# Patient Record
Sex: Female | Born: 1985 | Race: Black or African American | Hispanic: No | Marital: Single
Health system: Southern US, Community
[De-identification: ages and names within clinical notes are randomized; demographics above are authoritative.]

## PROBLEM LIST (undated history)

## (undated) DIAGNOSIS — J302 Other seasonal allergic rhinitis: Secondary | ICD-10-CM

## (undated) DIAGNOSIS — L309 Dermatitis, unspecified: Secondary | ICD-10-CM

## (undated) DIAGNOSIS — F419 Anxiety disorder, unspecified: Secondary | ICD-10-CM

## (undated) DIAGNOSIS — O139 Gestational [pregnancy-induced] hypertension without significant proteinuria, unspecified trimester: Secondary | ICD-10-CM

## (undated) DIAGNOSIS — T7840XA Allergy, unspecified, initial encounter: Secondary | ICD-10-CM

## (undated) DIAGNOSIS — J069 Acute upper respiratory infection, unspecified: Secondary | ICD-10-CM

## (undated) DIAGNOSIS — K219 Gastro-esophageal reflux disease without esophagitis: Secondary | ICD-10-CM

## (undated) DIAGNOSIS — J45909 Unspecified asthma, uncomplicated: Secondary | ICD-10-CM

## (undated) DIAGNOSIS — T783XXA Angioneurotic edema, initial encounter: Secondary | ICD-10-CM

## (undated) DIAGNOSIS — D649 Anemia, unspecified: Secondary | ICD-10-CM

## (undated) HISTORY — PX: TYMPANOSTOMY TUBE PLACEMENT: SHX32

## (undated) HISTORY — DX: Angioneurotic edema, initial encounter: T78.3XXA

## (undated) HISTORY — PX: NO PAST SURGERIES: SHX2092

## (undated) HISTORY — DX: Allergy, unspecified, initial encounter: T78.40XA

## (undated) HISTORY — DX: Anemia, unspecified: D64.9

## (undated) HISTORY — DX: Unspecified asthma, uncomplicated: J45.909

## (undated) HISTORY — DX: Acute upper respiratory infection, unspecified: J06.9

---
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
## 2008-02-03 ENCOUNTER — Emergency Department (HOSPITAL_COMMUNITY): Admission: EM | Admit: 2008-02-03 | Discharge: 2008-02-03 | Payer: Self-pay | Admitting: Family Medicine

## 2009-04-10 ENCOUNTER — Emergency Department (HOSPITAL_COMMUNITY): Admission: EM | Admit: 2009-04-10 | Discharge: 2009-04-10 | Payer: Self-pay | Admitting: Emergency Medicine

## 2012-01-12 ENCOUNTER — Encounter (HOSPITAL_COMMUNITY): Payer: Self-pay | Admitting: *Deleted

## 2012-01-12 ENCOUNTER — Emergency Department (HOSPITAL_COMMUNITY)
Admission: EM | Admit: 2012-01-12 | Discharge: 2012-01-12 | Disposition: A | Discharge status: Home / Self Care | Payer: BC Managed Care – PPO | Source: Home / Self Care | Attending: Emergency Medicine | Admitting: Emergency Medicine

## 2012-01-12 DIAGNOSIS — R05 Cough: Secondary | ICD-10-CM

## 2012-01-12 HISTORY — DX: Other seasonal allergic rhinitis: J30.2

## 2012-01-12 MED ORDER — AZITHROMYCIN 250 MG PO TABS: 250.0000 mg | ORAL_TABLET | Freq: Every day | ORAL | Status: DC

## 2012-01-12 MED ORDER — GUAIFENESIN-CODEINE 100-10 MG/5ML PO SYRP: 5.0000 mL | ORAL_SOLUTION | Freq: Three times a day (TID) | ORAL | Status: DC | PRN

## 2012-01-12 MED ORDER — CETIRIZINE-PSEUDOEPHEDRINE ER 5-120 MG PO TB12: 1.0000 | ORAL_TABLET | Freq: Every day | ORAL | Status: DC

## 2012-01-12 NOTE — ED Notes (Signed)
Pt  Has  Symptoms  Of  Cough    /  Congested   Started  Off  With a  sorethroat        symptooms x  3  Days  -    Pt      Reports  Has  Seasonal  allergys             And  Has  Been taking  Robitussin     otc  For the  Symptoms  Pt  Reports  Pain in  Her  Sides  When  She  Coughs

## 2012-01-12 NOTE — ED Provider Notes (Addendum)
History     CSN: [id number]  Arrival date & time 01/12/12  1533   First MD Initiated Contact with Patient 01/12/12 1641      Chief Complaint  Patient presents with  . Cough    (Consider location/radiation/quality/duration/timing/severity/associated sxs/prior treatment) HPI Comments: Patient presents urgent care complaining of cough and congestion that started with a sore throat. In some sinus pressure and congestion this being the third day that she started developing the symptoms and has been taking over-the-counter Robitussin. With no significant improvement. Patient denies any shortness of breath fevers or body aches. Have had some non-intermittent abdominal pains that come and go but denies any nausea vomiting or diarrhea as.  Patient is a 26 y.o. female presenting with cough. The history is provided by the patient.  Cough This is a new problem. The problem occurs constantly. The problem has been gradually worsening. The cough is productive of sputum. There has been no fever. Associated symptoms include ear congestion, rhinorrhea and sore throat. Pertinent negatives include no chest pain, no chills, no sweats, no myalgias, no shortness of breath, no wheezing and no eye redness. She has tried decongestants for the symptoms. The treatment provided no relief. She is not a smoker. Her past medical history does not include pneumonia, COPD or asthma.    Past Medical History  Diagnosis Date  . Seasonal allergies     History reviewed. No pertinent past surgical history.  Family History  Problem Relation Age of Onset  . Hypertension Mother     History  Substance Use Topics  . Smoking status: Never Smoker   . Smokeless tobacco: Not on file  . Alcohol Use: No    OB History    Grav Para Term Preterm Abortions TAB SAB Ect Mult Living                  Review of Systems  Constitutional: Positive for activity change. Negative for fever and chills.  HENT: Positive for congestion,  sore throat and rhinorrhea. Negative for facial swelling, neck pain and neck stiffness.   Eyes: Negative for redness.  Respiratory: Positive for cough. Negative for apnea, chest tightness, shortness of breath and wheezing.   Cardiovascular: Negative for chest pain.  Gastrointestinal: Negative for abdominal pain and diarrhea.  Musculoskeletal: Negative for myalgias and back pain.  Skin: Negative for rash.  Hematological: Negative for adenopathy.    Allergies  Review of patient's allergies indicates no known allergies.  Home Medications   Current Outpatient Rx  Name  Route  Sig  Dispense  Refill  . ROBITUSSIN DM PO   Oral   Take by mouth.         . AZITHROMYCIN 250 MG PO TABS   Oral   Take 1 tablet (250 mg total) by mouth daily. Take first 2 tablets together, then 1 every day until finished.   6 tablet   0   . CETIRIZINE-PSEUDOEPHEDRINE ER 5-120 MG PO TB12   Oral   Take 1 tablet by mouth daily.   15 tablet   0   . GUAIFENESIN-CODEINE 100-10 MG/5ML PO SYRP   Oral   Take 5 mLs by mouth 3 (three) times daily as needed for cough.   120 mL   0     BP 130/73  Pulse 86  Temp 99.4 F (37.4 C) (Oral)  Resp 17  SpO2 100%  LMP 01/12/2012  Physical Exam  Nursing note and vitals reviewed. Constitutional: Vital signs are normal. She appears  well-developed and well-nourished.  Non-toxic appearance. She does not have a sickly appearance. She does not appear ill. No distress.  HENT:  Head: Normocephalic.  Right Ear: Tympanic membrane normal.  Left Ear: Tympanic membrane normal.  Nose: Nose normal.  Mouth/Throat: Uvula is midline and mucous membranes are normal. Posterior oropharyngeal erythema present.  Eyes: Conjunctivae normal and EOM are normal. Pupils are equal, round, and reactive to light. Right eye exhibits no discharge. Left eye exhibits no discharge.  Neck: Neck supple. No JVD present.  Cardiovascular: Normal rate.  Exam reveals no gallop and no friction rub.   No  murmur heard. Pulmonary/Chest: Effort normal and breath sounds normal.  Abdominal: She exhibits no mass. There is no tenderness. There is no rebound and no guarding.  Musculoskeletal: Normal range of motion.  Lymphadenopathy:    She has no cervical adenopathy.  Neurological: She is alert.  Skin: No rash noted. No erythema.    ED Course  Procedures (including critical care time)  Labs Reviewed - No data to display No results found.   1. Cough       MDM  Cough and pharyngitis most likely viral. Patient been symptomatic for 5 days. Had an unremarkable exam. Patient argumentative that she needs an antibiotic. Have recommended symptomatic management for the next 2-3 days and if worsening pain to start with provided macrolide antibiotics.        Jimmie Molly, MD 01/12/12 1710  Jimmie Molly, MD 01/12/12 [phone]

## 2012-11-11 ENCOUNTER — Emergency Department (HOSPITAL_COMMUNITY): Payer: BC Managed Care – PPO

## 2012-11-11 ENCOUNTER — Encounter (HOSPITAL_COMMUNITY): Payer: Self-pay | Admitting: Emergency Medicine

## 2012-11-11 ENCOUNTER — Emergency Department (INDEPENDENT_AMBULATORY_CARE_PROVIDER_SITE_OTHER): Payer: BC Managed Care – PPO

## 2012-11-11 ENCOUNTER — Emergency Department (HOSPITAL_COMMUNITY)
Admission: EM | Admit: 2012-11-11 | Discharge: 2012-11-11 | Disposition: A | Discharge status: Home / Self Care | Payer: BC Managed Care – PPO | Source: Home / Self Care | Attending: Family Medicine | Admitting: Family Medicine

## 2012-11-11 DIAGNOSIS — R05 Cough: Secondary | ICD-10-CM

## 2012-11-11 DIAGNOSIS — J45909 Unspecified asthma, uncomplicated: Secondary | ICD-10-CM

## 2012-11-11 LAB — POCT PREGNANCY, URINE: Preg Test, Ur: NEGATIVE

## 2012-11-11 MED ORDER — HYDROCOD POLST-CHLORPHEN POLST 10-8 MG/5ML PO LQCR: 5.0000 mL | Freq: Two times a day (BID) | ORAL | Status: DC | PRN

## 2012-11-11 MED ORDER — AZITHROMYCIN 250 MG PO TABS: ORAL_TABLET | ORAL | Status: DC

## 2012-11-11 MED ORDER — METHYLPREDNISOLONE SODIUM SUCC 125 MG IJ SOLR
125.0000 mg | Freq: Once | INTRAMUSCULAR | Status: AC
  Administered 2012-11-11: 125 mg via INTRAMUSCULAR

## 2012-11-11 MED ORDER — METHYLPREDNISOLONE SODIUM SUCC 125 MG IJ SOLR
INTRAMUSCULAR | Status: AC
  Filled 2012-11-11: qty 2

## 2012-11-11 NOTE — ED Provider Notes (Signed)
CSN: [id number]     Arrival date & time 11/11/12  1833 History   None    Chief Complaint  Patient presents with  . URI   (Consider location/radiation/quality/duration/timing/severity/associated sxs/prior Treatment) Patient is a 27 y.o. female presenting with URI. The history is provided by the patient.  URI Presenting symptoms: congestion, cough, fever and rhinorrhea   Severity:  Moderate Onset quality:  Gradual Duration:  5 days Progression:  Worsening Chronicity:  New Ineffective treatments:  OTC medications Associated symptoms: wheezing     Past Medical History  Diagnosis Date  . Seasonal allergies    History reviewed. No pertinent past surgical history. Family History  Problem Relation Age of Onset  . Hypertension Mother    History  Substance Use Topics  . Smoking status: Never Smoker   . Smokeless tobacco: Not on file  . Alcohol Use: Yes   OB History   Grav Para Term Preterm Abortions TAB SAB Ect Mult Living                 Review of Systems  Constitutional: Positive for fever.  HENT: Positive for congestion and rhinorrhea.   Respiratory: Positive for cough and wheezing.   Cardiovascular: Negative.   Gastrointestinal: Negative.   Genitourinary: Negative.     Allergies  Review of patient's allergies indicates no known allergies.  Home Medications   Current Outpatient Rx  Name  Route  Sig  Dispense  Refill  . azithromycin (ZITHROMAX Z-PAK) 250 MG tablet      Take as directed on pack   6 each   0   . azithromycin (ZITHROMAX) 250 MG tablet   Oral   Take 1 tablet (250 mg total) by mouth daily. Take first 2 tablets together, then 1 every day until finished.   6 tablet   0   . cetirizine-pseudoephedrine (ZYRTEC-D) 5-120 MG per tablet   Oral   Take 1 tablet by mouth daily.   15 tablet   0   . chlorpheniramine-HYDROcodone (TUSSIONEX PENNKINETIC ER) 10-8 MG/5ML LQCR   Oral   Take 5 mLs by mouth every 12 (twelve) hours as needed.   115 mL   0    . Dextromethorphan-Guaifenesin (ROBITUSSIN DM PO)   Oral   Take by mouth.         Marland Kitchen guaiFENesin-codeine (ROBITUSSIN AC) 100-10 MG/5ML syrup   Oral   Take 5 mLs by mouth 3 (three) times daily as needed for cough.   120 mL   0    BP 128/85  Pulse 108  Temp(Src) 100 F (37.8 C) (Oral)  Resp 18  SpO2 98%  LMP 10/17/2012 Physical Exam  Nursing note and vitals reviewed. Constitutional: She is oriented to person, place, and time. She appears well-developed and well-nourished.  HENT:  Head: Normocephalic.  Right Ear: External ear normal.  Left Ear: External ear normal.  Mouth/Throat: Oropharynx is clear and moist.  Eyes: Conjunctivae are normal. Pupils are equal, round, and reactive to light.  Neck: Normal range of motion. Neck supple.  Cardiovascular: Normal rate, regular rhythm, normal heart sounds and intact distal pulses.   Pulmonary/Chest: No respiratory distress. She has wheezes in the right upper field, the right middle field, the right lower field, the left upper field, the left middle field and the left lower field.  Abdominal: Soft. Bowel sounds are normal.  Lymphadenopathy:    She has no cervical adenopathy.  Neurological: She is alert and oriented to person, place, and time.  Skin:  Skin is warm and dry.    ED Course  Procedures (including critical care time) Labs Review Labs Reviewed  POCT PREGNANCY, URINE   Imaging Review Dg Chest 2 View  11/11/2012   CLINICAL DATA:  Pain and shortness of breath  EXAM: CHEST  2 VIEW  COMPARISON:  Study obtained earlier in the day  FINDINGS: Lungs are clear. Heart size and pulmonary vascularity are normal. No adenopathy. No bone lesions. No pneumothorax.  IMPRESSION: No edema or consolidation.   Electronically Signed   By: Bretta Bang   On: 11/11/2012 21:28   Dg Chest 2 View  11/11/2012   **ADDENDUM** CREATED: 11/11/2012 20:55:18  Note, this radiograph is not for patient Maegen, Wigle, as was initially submitted.   **END ADDENDUM** SIGNED BY: Dyanne Carrel, MD  11/11/2012   *RADIOLOGY REPORT*  Clinical Data: Cough, shortness of breath, sneezing, initial encounter.  CHEST - 2 VIEW  Comparison: None.  Findings:  Normal cardiac silhouette and mediastinal contours.  No focal parenchymal opacities.  No pleural effusion or pneumothorax.  No evidence of edema.  No acute osseous abnormalities.  IMPRESSION: No acute cardiopulmonary disease.  Specifically, no evidence of pneumonia.   Original Report Authenticated By: Tacey Ruiz, MD    MDM  X-rays reviewed and report per radiologist.     Linna Hoff, MD 11/11/12 2141

## 2012-11-11 NOTE — ED Notes (Signed)
Pt c/o cold sxs onset Saturday Sxs include: fever, productive cough, nasal congestion, SOB, wheezing and back pain Denies: v/n/d Taking OTC cold meds w/temp relief.  She is alert w/no signs of acute distress.

## 2012-11-23 ENCOUNTER — Other Ambulatory Visit (HOSPITAL_COMMUNITY): Payer: Self-pay | Admitting: Family Medicine

## 2012-11-23 DIAGNOSIS — R05 Cough: Secondary | ICD-10-CM

## 2013-06-02 ENCOUNTER — Encounter (HOSPITAL_COMMUNITY): Payer: Self-pay | Admitting: Emergency Medicine

## 2013-06-02 ENCOUNTER — Emergency Department (HOSPITAL_COMMUNITY)
Admission: EM | Admit: 2013-06-02 | Discharge: 2013-06-02 | Disposition: A | Discharge status: Home / Self Care | Payer: BC Managed Care – PPO | Source: Home / Self Care | Attending: Family Medicine | Admitting: Family Medicine

## 2013-06-02 DIAGNOSIS — J309 Allergic rhinitis, unspecified: Secondary | ICD-10-CM

## 2013-06-02 DIAGNOSIS — J45901 Unspecified asthma with (acute) exacerbation: Secondary | ICD-10-CM

## 2013-06-02 MED ORDER — ALBUTEROL SULFATE HFA 108 (90 BASE) MCG/ACT IN AERS: 2.0000 | INHALATION_SPRAY | Freq: Four times a day (QID) | RESPIRATORY_TRACT | Status: DC | PRN

## 2013-06-02 MED ORDER — GUAIFENESIN-CODEINE 100-10 MG/5ML PO SOLN: 5.0000 mL | Freq: Every evening | ORAL | Status: DC | PRN

## 2013-06-02 MED ORDER — IPRATROPIUM-ALBUTEROL 0.5-2.5 (3) MG/3ML IN SOLN
3.0000 mL | Freq: Once | RESPIRATORY_TRACT | Status: AC
  Administered 2013-06-02: 3 mL via RESPIRATORY_TRACT

## 2013-06-02 MED ORDER — IPRATROPIUM-ALBUTEROL 0.5-2.5 (3) MG/3ML IN SOLN
RESPIRATORY_TRACT | Status: AC
  Filled 2013-06-02: qty 3

## 2013-06-02 MED ORDER — PREDNISONE 50 MG PO TABS: 50.0000 mg | ORAL_TABLET | Freq: Every day | ORAL | Status: DC

## 2013-06-02 NOTE — ED Notes (Signed)
Pt c/o cold sx onset 2 weeks Sx include: SOB, wheezing, productive cough, nauseas, congestion Denies f/v/d Taking OTC cold/allergy meds w/no relief Alert; wheezing through out; no signs of acute distress.

## 2013-06-02 NOTE — ED Provider Notes (Signed)
Tammy Butler is a 28 y.o. female who presents to Urgent Care today for chest congestion cough chills and wheezing. Symptoms are associated with runny nose present for about 2 weeks. No fevers chills nausea vomiting or diarrhea. Patient has tried NyQuil Zyrtec and Nasacort spray which has helped a little. She has a history of asthma. She does not have albuterol.   Past Medical History  Diagnosis Date  . Seasonal allergies    History  Substance Use Topics  . Smoking status: Never Smoker   . Smokeless tobacco: Not on file  . Alcohol Use: Yes   ROS as above Medications: No current facility-administered medications for this encounter.   Current Outpatient Prescriptions  Medication Sig Dispense Refill  . albuterol (PROVENTIL HFA;VENTOLIN HFA) 108 (90 BASE) MCG/ACT inhaler Inhale 2 puffs into the lungs every 6 (six) hours as needed for wheezing or shortness of breath.  1 Inhaler  2  . guaiFENesin-codeine 100-10 MG/5ML syrup Take 5 mLs by mouth at bedtime as needed for cough.  120 mL  0  . predniSONE (DELTASONE) 50 MG tablet Take 1 tablet (50 mg total) by mouth daily.  5 tablet  0    Exam:  BP 148/96  Pulse 79  Temp(Src) 98.8 F (37.1 C) (Oral)  Resp 23  SpO2 100%  LMP 04/15/2013 Gen: Well NAD HEENT: EOMI,  MMM Lungs: Normal work of breathing. Expiratory wheezing present bilaterally. Heart: RRR no MRG Abd: NABS, Soft. NT, ND Exts: Brisk capillary refill, warm and well perfused.   Pt was given a duoneb treatment and felt better with improvement with pulmonary exam.   No results found for this or any previous visit (from the past 24 hour(s)). No results found.  Assessment and Plan: 28 y.o. female with asthma exacerbation. Plan to treat with prednisone and albuterol as well as codeine cough medicine.    Discussed warning signs or symptoms. Please see discharge instructions. Patient expresses understanding.    Rodolph BongEvan S Corey, MD 06/02/13 [phone]

## 2013-06-02 NOTE — Discharge Instructions (Signed)
Thank you for coming in today. Call or go to the emergency room if you get worse, have trouble breathing, have chest pains, or palpitations.   Asthma, Acute Bronchospasm Acute bronchospasm caused by asthma is also referred to as an asthma attack. Bronchospasm means your air passages become narrowed. The narrowing is caused by inflammation and tightening of the muscles in the air tubes (bronchi) in your lungs. This can make it hard to breath or cause you to wheeze and cough. CAUSES Possible triggers are:  Animal dander from the skin, hair, or feathers of animals.  Dust mites contained in house dust.  Cockroaches.  Pollen from trees or grass.  Mold.  Cigarette or tobacco smoke.  Air pollutants such as dust, household cleaners, hair sprays, aerosol sprays, paint fumes, strong chemicals, or strong odors.  Cold air or weather changes. Cold air may trigger inflammation. Winds increase molds and pollens in the air.  Strong emotions such as crying or laughing hard.  Stress.  Certain medicines such as aspirin or beta-blockers.  Sulfites in foods and drinks, such as dried fruits and wine.  Infections or inflammatory conditions, such as a flu, cold, or inflammation of the nasal membranes (rhinitis).  Gastroesophageal reflux disease (GERD). GERD is a condition where stomach acid backs up into your throat (esophagus).  Exercise or strenuous activity. SIGNS AND SYMPTOMS   Wheezing.  Excessive coughing, particularly at night.  Chest tightness.  Shortness of breath. DIAGNOSIS  Your health care provider will ask you about your medical history and perform a physical exam. A chest X-ray or blood testing may be performed to look for other causes of your symptoms or other conditions that may have triggered your asthma attack. TREATMENT  Treatment is aimed at reducing inflammation and opening up the airways in your lungs. Most asthma attacks are treated with inhaled medicines. These  include quick relief or rescue medicines (such as bronchodilators) and controller medicines (such as inhaled corticosteroids). These medicines are sometimes given through an inhaler or a nebulizer. Systemic steroid medicine taken by mouth or given through an IV tube also can be used to reduce the inflammation when an attack is moderate or severe. Antibiotic medicines are only used if a bacterial infection is present.  HOME CARE INSTRUCTIONS   Rest.  Drink plenty of liquids. This helps the mucus to remain thin and be easily coughed up. Only use caffeine in moderation and do not use alcohol until you have recovered from your illness.  Do not smoke. Avoid being exposed to secondhand smoke.  You play a critical role in keeping yourself in good health. Avoid exposure to things that cause you to wheeze or to have breathing problems.  Keep your medicines up to date and available. Carefully follow your health care provider's treatment plan.  Take your medicine exactly as prescribed.  When pollen or pollution is bad, keep windows closed and use an air conditioner or go to places with air conditioning.  Asthma requires careful medical care. See your health care provider for a follow-up as advised. If you are more than [redacted] weeks pregnant and you were prescribed any new medicines, let your obstetrician know about the visit and how you are doing. Follow-up with your health care provider as directed.  After you have recovered from your asthma attack, make an appointment with your outpatient doctor to talk about ways to reduce the likelihood of future attacks. If you do not have a doctor who manages your asthma, make an appointment with  a primary care doctor to discuss your asthma. SEEK IMMEDIATE MEDICAL CARE IF:   You are getting worse.  You have trouble breathing. If severe, call your local emergency services (911 in the [place]  You develop chest pain or discomfort.  You are vomiting.  You are not  able to keep fluids down.  You are coughing up yellow, green, brown, or bloody sputum.  You have a fever and your symptoms suddenly get worse.  You have trouble swallowing. MAKE SURE YOU:   Understand these instructions.  Will watch your condition.  Will get help right away if you are not doing well or get worse. Document Released: 05/15/2006 Document Revised: 09/30/2012 Document Reviewed: 08/05/2012 [facility] Patient Information 2014 [place], [place].  Allergies Allergies may happen from anything your body is sensitive to. This may be food, medicines, pollens, chemicals, and nearly anything around you in everyday life that produces allergens. An allergen is anything that causes an allergy producing substance. Heredity is often a factor in causing these problems. This means you may have some of the same allergies as your parents. Food allergies happen in all age groups. Food allergies are some of the most severe and life threatening. Some common food allergies are cow's milk, seafood, eggs, nuts, wheat, and soybeans. SYMPTOMS   Swelling around the mouth.  An itchy red rash or hives.  Vomiting or diarrhea.  Difficulty breathing. SEVERE ALLERGIC REACTIONS ARE LIFE-THREATENING. This reaction is called anaphylaxis. It can cause the mouth and throat to swell and cause difficulty with breathing and swallowing. In severe reactions only a trace amount of food (for example, peanut oil in a salad) may cause death within seconds. Seasonal allergies occur in all age groups. These are seasonal because they usually occur during the same season every year. They may be a reaction to molds, grass pollens, or tree pollens. Other causes of problems are house dust mite allergens, pet dander, and mold spores. The symptoms often consist of nasal congestion, a runny itchy nose associated with sneezing, and tearing itchy eyes. There is often an associated itching of the mouth and ears. The problems happen when  you come in contact with pollens and other allergens. Allergens are the particles in the air that the body reacts to with an allergic reaction. This causes you to release allergic antibodies. Through a chain of events, these eventually cause you to release histamine into the blood stream. Although it is meant to be protective to the body, it is this release that causes your discomfort. This is why you were given anti-histamines to feel better. If you are unable to pinpoint the offending allergen, it may be determined by skin or blood testing. Allergies cannot be cured but can be controlled with medicine. Hay fever is a collection of all or some of the seasonal allergy problems. It may often be treated with simple over-the-counter medicine such as diphenhydramine. Take medicine as directed. Do not drink alcohol or drive while taking this medicine. Check with your caregiver or package insert for child dosages. If these medicines are not effective, there are many new medicines your caregiver can prescribe. Stronger medicine such as nasal spray, eye drops, and corticosteroids may be used if the first things you try do not work well. Other treatments such as immunotherapy or desensitizing injections can be used if all else fails. Follow up with your caregiver if problems continue. These seasonal allergies are usually not life threatening. They are generally more of a nuisance that can often  be handled using medicine. HOME CARE INSTRUCTIONS   If unsure what causes a reaction, keep a diary of foods eaten and symptoms that follow. Avoid foods that cause reactions.  If hives or rash are present:  Take medicine as directed.  You may use an over-the-counter antihistamine (diphenhydramine) for hives and itching as needed.  Apply cold compresses (cloths) to the skin or take baths in cool water. Avoid hot baths or showers. Heat will make a rash and itching worse.  If you are severely allergic:  Following a  treatment for a severe reaction, hospitalization is often required for closer follow-up.  Wear a medic-alert bracelet or necklace stating the allergy.  You and your family must learn how to give adrenaline or use an anaphylaxis kit.  If you have had a severe reaction, always carry your anaphylaxis kit or EpiPen with you. Use this medicine as directed by your caregiver if a severe reaction is occurring. Failure to do so could have a fatal outcome. SEEK MEDICAL CARE IF:  You suspect a food allergy. Symptoms generally happen within 30 minutes of eating a food.  Your symptoms have not gone away within 2 days or are getting worse.  You develop new symptoms.  You want to retest yourself or your child with a food or drink you think causes an allergic reaction. Never do this if an anaphylactic reaction to that food or drink has happened before. Only do this under the care of a caregiver. SEEK IMMEDIATE MEDICAL CARE IF:   You have difficulty breathing, are wheezing, or have a tight feeling in your chest or throat.  You have a swollen mouth, or you have hives, swelling, or itching all over your body.  You have had a severe reaction that has responded to your anaphylaxis kit or an EpiPen. These reactions may return when the medicine has worn off. These reactions should be considered life threatening. MAKE SURE YOU:   Understand these instructions.  Will watch your condition.  Will get help right away if you are not doing well or get worse. Document Released: 04/23/2002 Document Revised: 05/25/2012 Document Reviewed: 09/28/2007 [facility] Patient Information 2014 [place].

## 2013-09-21 ENCOUNTER — Ambulatory Visit (INDEPENDENT_AMBULATORY_CARE_PROVIDER_SITE_OTHER): Payer: BC Managed Care – PPO | Admitting: Internal Medicine

## 2013-09-21 VITALS — BP 133/88 | HR 71 | Temp 97.7°F | Resp 16 | Ht 60.75 in | Wt 209.0 lb

## 2013-09-21 DIAGNOSIS — D649 Anemia, unspecified: Secondary | ICD-10-CM

## 2013-09-21 DIAGNOSIS — R351 Nocturia: Secondary | ICD-10-CM

## 2013-09-21 DIAGNOSIS — R350 Frequency of micturition: Secondary | ICD-10-CM

## 2013-09-21 DIAGNOSIS — N926 Irregular menstruation, unspecified: Secondary | ICD-10-CM

## 2013-09-21 LAB — POCT URINALYSIS DIPSTICK
BILIRUBIN UA: NEGATIVE
Blood, UA: NEGATIVE
Glucose, UA: NEGATIVE
Ketones, UA: NEGATIVE
Leukocytes, UA: NEGATIVE
NITRITE UA: NEGATIVE
PH UA: 6
PROTEIN UA: NEGATIVE
Spec Grav, UA: 1.025
Urobilinogen, UA: 0.2

## 2013-09-21 LAB — POCT CBC
Granulocyte percent: 57.6 %G (ref 37–80)
HCT, POC: 37.6 % — AB (ref 37.7–47.9)
HEMOGLOBIN: 11.8 g/dL — AB (ref 12.2–16.2)
LYMPH, POC: 2.6 (ref 0.6–3.4)
MCH, POC: 24.4 pg — AB (ref 27–31.2)
MCHC: 31.4 g/dL — AB (ref 31.8–35.4)
MCV: 77.7 fL — AB (ref 80–97)
MID (cbc): 0.4 (ref 0–0.9)
MPV: 9 fL (ref 0–99.8)
POC GRANULOCYTE: 4.1 (ref 2–6.9)
POC LYMPH PERCENT: 36.7 %L (ref 10–50)
POC MID %: 5.7 %M (ref 0–12)
Platelet Count, POC: 278 10*3/uL (ref 142–424)
RBC: 4.84 M/uL (ref 4.04–5.48)
RDW, POC: 17.8 %
WBC: 7.2 10*3/uL (ref 4.6–10.2)

## 2013-09-21 LAB — POCT UA - MICROSCOPIC ONLY
CRYSTALS, UR, HPF, POC: NEGATIVE
Casts, Ur, LPF, POC: NEGATIVE
Mucus, UA: NEGATIVE
Yeast, UA: NEGATIVE

## 2013-09-21 LAB — POCT URINE PREGNANCY: PREG TEST UR: NEGATIVE

## 2013-09-21 NOTE — Progress Notes (Signed)
Subjective:    Patient ID: Tammy Butler, female    DOB: [birth year], 28 y.o.   MRN: [id number] This chart was scribed for Tammy Siaobert Ladislav Caselli, MD by Evon Slackerrance Branch, ED Scribe. This Patient was seen in room 04 and the patients care was started at 8:06 PM  HPI Chief Complaint  Patient presents with  . Menstrual Problem    Negative Home UPT    HPI Comments: [name] is a 28 y.o. female who presents to the [facility] complaining of irregular period onset 3 months prior. She state she has some spotting in between. She states she has had associated  nausea, fatigue, constipation, urinary frequency(no Dysuria) and headaches. Last normal menstrual period- first week of June  She states the spotting started around July 8th for 1 week, then 2 weeks after she had spotting for 1 day. States she is not pregnant or at least neg home tests x2..  She states she has been under a lot of stress the past 3 months due to work/she is a worrier/occas aff sleep. She would be happy if she got pregnant and so she doesn't use any contraception. She states she doesn't have any kids at this time. Stead relat 3 yr.  Denies vomiting , dysuria, back pain, breast pain, dyspareunia, diarrhea, chills, or visual disturbance.  Denies hx of hypertension or diabetes.   Her headaches are daily and occur all over the head in different spots, including pain radiating from the neck. Layering never disabling and will respond to OTC meds.  There are no active problems to display for this patient. but hx= Prior to Admission medications   Medication Sig Start Date End Date Taking? Authorizing Provider  albuterol (PROVENTIL HFA;VENTOLIN HFA) 108 (90 BASE) MCG/ACT inhaler Inhale 2 puffs into the lungs every 6 (six) hours as needed for wheezing or shortness of breath. 06/02/13  Yes prn Rodolph BongEvan S Corey, MD  mometasone (NASONEX) 50 MCG/ACT nasal spray Place 2 sprays into the nose daily.   Yes prn Historical  Provider, MD    Review of Systems  Constitutional: Positive for fatigue. Negative for fever, chills, appetite change and unexpected weight change.  HENT: Negative for trouble swallowing.   Eyes: Negative for photophobia and visual disturbance.  Respiratory: Negative for shortness of breath.   Cardiovascular: Negative for chest pain, palpitations and leg swelling.  Gastrointestinal: Positive for nausea and constipation. Negative for vomiting, abdominal pain, diarrhea and blood in stool.  Genitourinary: Positive for frequency. Negative for dysuria, hematuria, flank pain, vaginal discharge, genital sores and dyspareunia.  Musculoskeletal: Negative for back pain.  Neurological: Positive for headaches. Negative for dizziness, speech difficulty and light-headedness.  Psychiatric/Behavioral: Positive for sleep disturbance. Negative for behavioral problems and dysphoric mood. The patient is nervous/anxious.     Objective:    Physical Exam  Nursing note and vitals reviewed. Constitutional: She is oriented to person, place, and time. She appears well-developed and well-nourished. No distress.  HENT:  Head: Normocephalic and atraumatic.  Eyes: Conjunctivae and EOM are normal. Pupils are equal, round, and reactive to light.  Neck: Neck supple. No thyromegaly present.  Cardiovascular: Normal rate and regular rhythm.   Pulmonary/Chest: Effort normal. No respiratory distress. She has no wheezes.  Abdominal: Soft. There is no tenderness.  No cva tend  Musculoskeletal: Normal range of motion.  Lymphadenopathy:    She has no cervical adenopathy.  Neurological: She is alert and oriented to person, place, and time. She has normal reflexes.  No cranial nerve deficit.  Skin: Skin is warm and dry.  Psychiatric: She has a normal mood and affect. Her behavior is normal.   BP 133/88  Pulse 71  Temp(Src) 97.7 F (36.5 C) (Oral)  Resp 16  Ht 5' 0.75" (1.543 m)  Wt 209 lb (94.802 kg)  BMI 39.82 kg/m2   SpO2 100%  LMP 07/12/2013  Results for orders placed in visit on 09/21/13  COMPREHENSIVE METABOLIC PANEL      Result Value Ref Range   Sodium 139  135 - 145 mEq/L   Potassium 4.1  3.5 - 5.3 mEq/L   Chloride 104  96 - 112 mEq/L   CO2 28  19 - 32 mEq/L   Glucose, Bld 91  70 - 99 mg/dL   BUN 12  6 - 23 mg/dL   Creat 1.61  0.96 - 0.45 mg/dL   Total Bilirubin 0.2  0.2 - 1.2 mg/dL   Alkaline Phosphatase 58  39 - 117 U/L   AST 14  0 - 37 U/L   ALT 13  0 - 35 U/L   Total Protein 7.4  6.0 - 8.3 g/dL   Albumin 4.2  3.5 - 5.2 g/dL   Calcium 9.5  8.4 - 40.9 mg/dL  TSH      Result Value Ref Range   TSH 2.090  0.350 - 4.500 uIU/mL  POCT CBC      Result Value Ref Range   WBC 7.2  4.6 - 10.2 K/uL   Lymph, poc 2.6  0.6 - 3.4   POC LYMPH PERCENT 36.7  10 - 50 %L   MID (cbc) 0.4  0 - 0.9   POC MID % 5.7  0 - 12 %M   POC Granulocyte 4.1  2 - 6.9   Granulocyte percent 57.6  37 - 80 %G   RBC 4.84  4.04 - 5.48 M/uL   Hemoglobin 11.8 (*) 12.2 - 16.2 g/dL   HCT, POC 81.1 (*) 91.4 - 47.9 %   MCV 77.7 (*) 80 - 97 fL   MCH, POC 24.4 (*) 27 - 31.2 pg   MCHC 31.4 (*) 31.8 - 35.4 g/dL   RDW, POC 78.2     Platelet Count, POC 278  142 - 424 K/uL   MPV 9.0  0 - 99.8 fL  POCT URINALYSIS DIPSTICK      Result Value Ref Range   Color, UA yellow     Clarity, UA clear     Glucose, UA neg     Bilirubin, UA neg     Ketones, UA neg     Spec Grav, UA 1.025     Blood, UA neg     pH, UA 6.0     Protein, UA neg     Urobilinogen, UA 0.2     Nitrite, UA neg     Leukocytes, UA Negative    POCT URINE PREGNANCY      Result Value Ref Range   Preg Test, Ur Negative    POCT UA - MICROSCOPIC ONLY      Result Value Ref Range   WBC, Ur, HPF, POC 0-2     RBC, urine, microscopic 0-1     Bacteria, U Microscopic trace     Mucus, UA neg     Epithelial cells, urine per micros 0-1     Crystals, Ur, HPF, POC neg     Casts, Ur, LPF, POC neg     Yeast, UA neg  Assessment & Plan:  u Frequency  irreg menstr  bleeding  Anemia, unspecified anemia type - Plan: Sickle cell screen, Iron and TIBC   At This point-recommend following the pattern of bleeding to see her regular periods are reestablished/this change is likely due to her current stress level We will set up a primary care provider for her so she can also have a Pap smear-overdue F/u 3-4 weeks   I have completed the patient encounter in its entirety as documented by the scribe, with editing by me where necessary. Kelliann Pendergraph P. Merla Riches, M.D.

## 2013-09-22 LAB — COMPREHENSIVE METABOLIC PANEL
ALK PHOS: 58 U/L (ref 39–117)
ALT: 13 U/L (ref 0–35)
AST: 14 U/L (ref 0–37)
Albumin: 4.2 g/dL (ref 3.5–5.2)
BUN: 12 mg/dL (ref 6–23)
CO2: 28 mEq/L (ref 19–32)
Calcium: 9.5 mg/dL (ref 8.4–10.5)
Chloride: 104 mEq/L (ref 96–112)
Creat: 0.78 mg/dL (ref 0.50–1.10)
Glucose, Bld: 91 mg/dL (ref 70–99)
Potassium: 4.1 mEq/L (ref 3.5–5.3)
SODIUM: 139 meq/L (ref 135–145)
Total Bilirubin: 0.2 mg/dL (ref 0.2–1.2)
Total Protein: 7.4 g/dL (ref 6.0–8.3)

## 2013-09-22 LAB — TSH: TSH: 2.09 u[IU]/mL (ref 0.350–4.500)

## 2013-09-23 LAB — IRON AND TIBC
%SAT: 8 % — AB (ref 20–55)
IRON: 33 ug/dL — AB (ref 42–145)
TIBC: 396 ug/dL (ref 250–470)
UIBC: 363 ug/dL (ref 125–400)

## 2013-09-23 LAB — SICKLE CELL SCREEN: Sickle Cell Screen: NEGATIVE

## 2013-09-23 NOTE — Progress Notes (Signed)
Patient will call back when she can figure out a day and time that will fit into her schedule.

## 2013-11-11 ENCOUNTER — Encounter: Payer: BC Managed Care – PPO | Admitting: Family Medicine

## 2014-03-21 ENCOUNTER — Ambulatory Visit (INDEPENDENT_AMBULATORY_CARE_PROVIDER_SITE_OTHER): Payer: BLUE CROSS/BLUE SHIELD

## 2014-03-21 ENCOUNTER — Ambulatory Visit (INDEPENDENT_AMBULATORY_CARE_PROVIDER_SITE_OTHER): Payer: BLUE CROSS/BLUE SHIELD | Admitting: Internal Medicine

## 2014-03-21 VITALS — BP 124/80 | HR 103 | Temp 98.0°F | Resp 18 | Ht 61.0 in | Wt 202.0 lb

## 2014-03-21 DIAGNOSIS — R079 Chest pain, unspecified: Secondary | ICD-10-CM

## 2014-03-21 DIAGNOSIS — Z6838 Body mass index (BMI) 38.0-38.9, adult: Secondary | ICD-10-CM

## 2014-03-21 LAB — POCT URINE PREGNANCY: PREG TEST UR: NEGATIVE

## 2014-03-21 MED ORDER — AMOXICILLIN 875 MG PO TABS
875.0000 mg | ORAL_TABLET | Freq: Two times a day (BID) | ORAL | Status: DC
Start: 2014-03-21 — End: 2014-10-16

## 2014-03-21 MED ORDER — LIDOCAINE VISCOUS 2 % MT SOLN: OROMUCOSAL | Status: DC

## 2014-03-21 NOTE — Progress Notes (Signed)
   Subjective:    Patient ID: Tammy Butler, female    DOB: 1985-02-28, 29 y.o.   MRN: [id number]  HPI  Chief Complaint  Patient presents with  . Nasal Congestion--4d hx cough in the morning with sore throat and copious postnasal drainage. Pressure in the maxillary areas for the last 24 hours with low-grade fever. No wheezing.   . Chest Pain--chest pain has been present for about 2 years intermittently. 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 Her pain resolved without treatment she has recurrent episodes of sharp stabbing pain in the left lateral rib cage that sometimes is transmitted to the scapular area in the back or into the sternum on the front  Never associated with shortness of breath or palpitations or diaphoresis or nausea    Currently in a stable relationship and has been not using contraception for the last 18 months but has not been able to get pregnant Last menstrual period 2 weeks ago- Review of Systems No fever chills or night sweats over the past 3-6 months except this illness No dyspnea on exertion or change in activity level No GI or GU symptoms No other bone joint symptoms    Objective:   Physical Exam BP 124/80 mmHg  Pulse 103  Temp(Src) 98 F (36.7 C) (Oral)  Resp 18  Ht 5\' 1"  (1.549 m)  Wt 202 lb (91.627 kg)  BMI 38.19 kg/m2  SpO2 98%  LMP 03/04/2014 Conjunctiva clear TMs clear Nares boggy with purulent discharge/tender maxillary areas to percussion Tonsils are slightly red without exudate No anterior cervical nodes Chest is clear to auscultation The chest wall is nontender to palpation and there is no swelling defect or ecchymoses Twisting to the right creates discomfort under the left scapula Forward flexion of the neck creates some discomfort in the same area Shoulder elevation on the left creates pain in the mid axillary line on the left  Results for orders placed or performed in  visit on 03/21/14  POCT urine pregnancy  Result Value Ref Range   Preg Test, Ur Negative       [facility] reading (PRIMARY) by  Dr. Seward Coran=NAD.   Assessment & Plan:  Chest pain of musculoskeletal etiology  Meloxicam and heat  Reassured  Sinusitis following viral URI  Amoxicillin  Sore throat  Viscous Xylocaine  BMI 38-Discussed approaches  Meds ordered this encounter  Medications  . amoxicillin (AMOXIL) 875 MG tablet    Sig: Take 1 tablet (875 mg total) by mouth 2 (two) times daily.    Dispense:  20 tablet    Refill:  0  . lidocaine (XYLOCAINE) 2 % solution    Sig: Use 1 teaspoon every 2 hours to gargle in throat, then swallow or spit (as needed for pain)    Dispense:  100 mL    Refill:  0

## 2014-05-03 ENCOUNTER — Ambulatory Visit (INDEPENDENT_AMBULATORY_CARE_PROVIDER_SITE_OTHER): Payer: BLUE CROSS/BLUE SHIELD | Admitting: Physician Assistant

## 2014-05-03 VITALS — BP 118/72 | HR 72 | Temp 98.2°F | Ht 61.0 in | Wt 205.6 lb

## 2014-05-03 DIAGNOSIS — J209 Acute bronchitis, unspecified: Secondary | ICD-10-CM

## 2014-05-03 DIAGNOSIS — L309 Dermatitis, unspecified: Secondary | ICD-10-CM

## 2014-05-03 MED ORDER — AZITHROMYCIN 250 MG PO TABS: ORAL_TABLET | ORAL | Status: DC

## 2014-05-03 NOTE — Progress Notes (Signed)
Subjective:    Patient ID: Tammy Butler, female    DOB: 02/09/1986, 29 y.o.   MRN: [id number]  HPI  This is a 29 year old female with PMH asthma who is presenting with chest congestion x 1 week. States she coughed up mucous with small amount of blood 3 days ago, none since. Mucous is pale yellow. Had some nasal congestion which has resolved. Has had to use albuterol twice since being sick. She does not use albuterol at baseline. Denies fever, chills, sore throat, otalgia. States 3 weeks ago she was treated for cough and sinusitis with amoxicillin. States she did not finish the course - she stopped after 6-7 days. A few days ago she started taking the leftover amoxicillin again. She has had 4 pills so far. She does not smoke but is exposed to 2nd hand smoke from her fiance who smokes indoors.  Upon examination, pt has eczema patches on her face between her eyebrows and on the right side of her nose. She states she has also has problems with eczema in that area as well as her wrists and antecubital fossas. The only thing she ever tries is vaseline and lotion although she admits she does not apply these consistently. She is wanting a referral to a dermatologist.  Review of Systems  Constitutional: Negative for fever and chills.  HENT: Negative for congestion, ear pain, sinus pressure and sore throat.   Respiratory: Positive for cough, shortness of breath and wheezing.   Gastrointestinal: Negative for nausea and vomiting.  Skin: Positive for color change.  Neurological: Negative for headaches.  Hematological: Negative for adenopathy.  Psychiatric/Behavioral: Negative for sleep disturbance.    Patient Active Problem List   Diagnosis Date Noted  . BMI 38.0-38.9,adult 03/21/2014   Prior to Admission medications   Medication Sig Start Date End Date Taking? Authorizing Provider  albuterol (PROVENTIL HFA;VENTOLIN HFA) 108 (90 BASE) MCG/ACT inhaler Inhale 2 puffs into the lungs every 6 (six)  hours as needed for wheezing or shortness of breath. 06/02/13  Yes Rodolph BongEvan S Corey, MD  amoxicillin (AMOXIL) 875 MG tablet Take 1 tablet (875 mg total) by mouth 2 (two) times daily. 03/21/14  Yes Tonye Pearsonobert P Doolittle, MD  mometasone (NASONEX) 50 MCG/ACT nasal spray Place 2 sprays into the nose daily.   Yes Historical Provider, MD   No Known Allergies  Patient's social and family history were reviewed.     Objective:   Physical Exam  Constitutional: She is oriented to person, place, and time. She appears well-developed and well-nourished. No distress.  HENT:  Head: Normocephalic and atraumatic.  Right Ear: Hearing, tympanic membrane, external ear and ear canal normal.  Left Ear: Hearing, tympanic membrane, external ear and ear canal normal.  Nose: Mucosal edema present. Right sinus exhibits no maxillary sinus tenderness and no frontal sinus tenderness. Left sinus exhibits no maxillary sinus tenderness and no frontal sinus tenderness.  Mouth/Throat: Uvula is midline and mucous membranes are normal. Posterior oropharyngeal erythema present. No oropharyngeal exudate or posterior oropharyngeal edema.  Eyes: Conjunctivae and lids are normal. Right eye exhibits no discharge. Left eye exhibits no discharge. No scleral icterus.  Cardiovascular: Normal rate, regular rhythm, normal heart sounds, intact distal pulses and normal pulses.   No murmur heard. Pulmonary/Chest: Effort normal and breath sounds normal. No respiratory distress. She has no wheezes. She has no rhonchi. She has no rales.  Musculoskeletal: Normal range of motion.  Lymphadenopathy:       Head (right side): No  submental, no submandibular and no tonsillar adenopathy present.       Head (left side): No submental, no submandibular and no tonsillar adenopathy present.    She has no cervical adenopathy.  Neurological: She is alert and oriented to person, place, and time.  Skin: Skin is warm, dry and intact.  Hypopigmented scaling patches  between eyebrows and over right side of nose  Psychiatric: She has a normal mood and affect. Her speech is normal and behavior is normal. Thought content normal.   BP 118/72 mmHg  Pulse 72  Temp(Src) 98.2 F (36.8 C) (Oral)  Ht  (1.549 m)  Wt 205 lb 9.6 oz (93.26 kg)  BMI 38.87 kg/m2  SpO2 96%  LMP 04/06/2014     Assessment & Plan:  1. Acute bronchitis, unspecified organism Likely pt is experiencing double sickening due to incomplete course of antibiotics. Will treat with zithromax. Lungs clear on exam. Albuterol prn wheezing/SOB. Counseled on the proper use of antibiotics. She will return in 7-10 days if symptoms are not improving. - azithromycin (ZITHROMAX) 250 MG tablet; Take 2 tabs PO x 1 dose, then 1 tab PO QD x 4 days  Dispense: 6 tablet; Refill: 0  2. Eczema Advised consistent daily vaseline application. Advised every other day application of 1 % hydrocortisone cream. Pt wishes to see a dermatologist for this issue - referral placed. - Ambulatory referral to Dermatology   Roswell Miners. Dyke Brackett, MHS [facility]  05/03/2014

## 2014-05-03 NOTE — Patient Instructions (Signed)
Take antibiotic until finished. Use albuterol as needed. Apply vaseline to eczema on face every day. Apply 1% hydrocortisone every other day to lesions. You will get a phone call to make appointment with dermatology. Minimize your second hand exposure!!!!!!!!!!!!!!!

## 2014-05-04 DIAGNOSIS — J45909 Unspecified asthma, uncomplicated: Secondary | ICD-10-CM | POA: Insufficient documentation

## 2014-05-04 DIAGNOSIS — L309 Dermatitis, unspecified: Secondary | ICD-10-CM | POA: Insufficient documentation

## 2014-10-16 ENCOUNTER — Emergency Department (HOSPITAL_BASED_OUTPATIENT_CLINIC_OR_DEPARTMENT_OTHER)
Admission: EM | Admit: 2014-10-16 | Discharge: 2014-10-16 | Disposition: A | Discharge status: Home / Self Care | Payer: BLUE CROSS/BLUE SHIELD | Attending: Emergency Medicine | Admitting: Emergency Medicine

## 2014-10-16 ENCOUNTER — Encounter (HOSPITAL_BASED_OUTPATIENT_CLINIC_OR_DEPARTMENT_OTHER): Payer: Self-pay

## 2014-10-16 DIAGNOSIS — J45909 Unspecified asthma, uncomplicated: Secondary | ICD-10-CM | POA: Diagnosis not present

## 2014-10-16 DIAGNOSIS — R000 Tachycardia, unspecified: Secondary | ICD-10-CM | POA: Diagnosis not present

## 2014-10-16 DIAGNOSIS — Z79899 Other long term (current) drug therapy: Secondary | ICD-10-CM | POA: Diagnosis not present

## 2014-10-16 DIAGNOSIS — R0981 Nasal congestion: Secondary | ICD-10-CM | POA: Diagnosis present

## 2014-10-16 DIAGNOSIS — J0100 Acute maxillary sinusitis, unspecified: Secondary | ICD-10-CM | POA: Insufficient documentation

## 2014-10-16 MED ORDER — AMOXICILLIN 500 MG PO CAPS
500.0000 mg | ORAL_CAPSULE | Freq: Once | ORAL | Status: AC
  Administered 2014-10-16: 500 mg via ORAL
  Filled 2014-10-16: qty 1

## 2014-10-16 MED ORDER — AMOXICILLIN 500 MG PO CAPS: 500.0000 mg | ORAL_CAPSULE | Freq: Three times a day (TID) | ORAL | Status: DC

## 2014-10-16 NOTE — ED Notes (Signed)
Pt reports 2 day history of nasal congestion, fever, body aches - Pt has had Theraflu x2 today.

## 2014-10-16 NOTE — ED Provider Notes (Signed)
CSN: [id number]     Arrival date & time 10/16/14  2202 History   First MD Initiated Contact with Patient 10/16/14 2209     Chief Complaint  Patient presents with  . Nasal Congestion     (Consider location/radiation/quality/duration/timing/severity/associated sxs/prior Treatment) HPI Comments: 29 year old female complaining of gradually worsening generalized body aches, nasal congestion, subjective fever, dry cough, facial pressure, bilateral ear pain 2 days. Tried taking TheraFlu twice with only mild relief. Initially she thought this may be allergies, however this is more severe than her allergies usually are. Recently was out with her child around a lot of people in public and believes she may have gotten sick. Denies any urinary symptoms, abdominal pain, chest pain, wheezing, shortness of breath, nausea or vomiting.  The history is provided by the patient.    Past Medical History  Diagnosis Date  . Seasonal allergies   . Allergy   . Asthma    History reviewed. No pertinent past surgical history. Family History  Problem Relation Age of Onset  . Hypertension Mother   . Stroke Mother   . Heart disease Mother   . Diabetes Maternal Grandmother    Social History  Substance Use Topics  . Smoking status: Passive Smoke Exposure - Never Smoker  . Smokeless tobacco: Never Used  . Alcohol Use: Yes     Comment: occasional   OB History    No data available     Review of Systems  Constitutional: Positive for fever, chills, appetite change and fatigue.  HENT: Positive for congestion.   Respiratory: Positive for cough.   Musculoskeletal: Positive for myalgias and arthralgias.  All other systems reviewed and are negative.     Allergies  Review of patient's allergies indicates no known allergies.  Home Medications   Prior to Admission medications   Medication Sig Start Date End Date Taking? Authorizing Provider  albuterol (PROVENTIL HFA;VENTOLIN HFA) 108 (90 BASE) MCG/ACT  inhaler Inhale 2 puffs into the lungs every 6 (six) hours as needed for wheezing or shortness of breath. 06/02/13  Yes Rodolph Bong, MD  mometasone (NASONEX) 50 MCG/ACT nasal spray Place 2 sprays into the nose daily.   Yes Historical Provider, MD  amoxicillin (AMOXIL) 500 MG capsule Take 1 capsule (500 mg total) by mouth 3 (three) times daily. 10/16/14   Kathrynn Speed, PA-C  azithromycin (ZITHROMAX) 250 MG tablet Take 2 tabs PO x 1 dose, then 1 tab PO QD x 4 days 05/03/14   Lanier Clam V, PA-C   BP 126/79 mmHg  Pulse 107  Temp(Src) 99.6 F (37.6 C) (Oral)  Resp 16  Ht 5' (1.524 m)  Wt 205 lb (92.987 kg)  BMI 40.04 kg/m2  SpO2 100%  LMP 09/25/2014 Physical Exam  Constitutional: She is oriented to person, place, and time. She appears well-developed and well-nourished. No distress.  HENT:  Head: Normocephalic and atraumatic.  Right Ear: Tympanic membrane normal.  Left Ear: Tympanic membrane normal.  Nose: Right sinus exhibits maxillary sinus tenderness. Left sinus exhibits maxillary sinus tenderness.  Mouth/Throat: Oropharynx is clear and moist.  Nasal congestion, mucosal edema, purulent post nasal drip.  Eyes: Conjunctivae and EOM are normal.  Neck: Normal range of motion. Neck supple.  Cardiovascular: Regular rhythm and normal heart sounds.   Mild tachy.  Pulmonary/Chest: Effort normal and breath sounds normal. No respiratory distress.  Musculoskeletal: Normal range of motion. She exhibits no edema.  Lymphadenopathy:    She has no cervical adenopathy.  Neurological: She is  alert and oriented to person, place, and time. No sensory deficit.  Skin: Skin is warm and dry.  Psychiatric: She has a normal mood and affect. Her behavior is normal.  Nursing note and vitals reviewed.   ED Course  Procedures (including critical care time) Labs Review Labs Reviewed - No data to display  Imaging Review No results found. I have personally reviewed and evaluated these images and lab results as  part of my medical decision-making.   EKG Interpretation None      MDM   Final diagnoses:  Acute maxillary sinusitis, recurrence not specified   Non-toxic/non-septic appearing, NAD. Mild tachy, vitals otherwise stable. Given significance of sinus tenderness and purulent post nasal drip, will start on amoxil. Advised her to use the nasonex she has at home. F/u with PCP. Stable for d/c. Return precautions given. Patient states understanding of treatment care plan and is agreeable.  Kathrynn Speed, PA-C 10/16/14 2254  Elwin Mocha, MD 10/16/14 [phone]

## 2014-10-16 NOTE — Discharge Instructions (Signed)
Take amoxicillin three times daily for 7 days. Use your nasal spray that you have at home. Rest and stay well hydrated.  Sinusitis Sinusitis is redness, soreness, and inflammation of the paranasal sinuses. Paranasal sinuses are air pockets within the bones of your face (beneath the eyes, the middle of the forehead, or above the eyes). In healthy paranasal sinuses, mucus is able to drain out, and air is able to circulate through them by way of your nose. However, when your paranasal sinuses are inflamed, mucus and air can become trapped. This can allow bacteria and other germs to grow and cause infection. Sinusitis can develop quickly and last only a short time (acute) or continue over a long period (chronic). Sinusitis that lasts for more than 12 weeks is considered chronic.  CAUSES  Causes of sinusitis include:  Allergies.  Structural abnormalities, such as displacement of the cartilage that separates your nostrils (deviated septum), which can decrease the air flow through your nose and sinuses and affect sinus drainage.  Functional abnormalities, such as when the small hairs (cilia) that line your sinuses and help remove mucus do not work properly or are not present. SIGNS AND SYMPTOMS  Symptoms of acute and chronic sinusitis are the same. The primary symptoms are pain and pressure around the affected sinuses. Other symptoms include:  Upper toothache.  Earache.  Headache.  Bad breath.  Decreased sense of smell and taste.  A cough, which worsens when you are lying flat.  Fatigue.  Fever.  Thick drainage from your nose, which often is green and may contain pus (purulent).  Swelling and warmth over the affected sinuses. DIAGNOSIS  Your health care provider will perform a physical exam. During the exam, your health care provider may:  Look in your nose for signs of abnormal growths in your nostrils (nasal polyps).  Tap over the affected sinus to check for signs of  infection.  View the inside of your sinuses (endoscopy) using an imaging device that has a light attached (endoscope). If your health care provider suspects that you have chronic sinusitis, one or more of the following tests may be recommended:  Allergy tests.  Nasal culture. A sample of mucus is taken from your nose, sent to a lab, and screened for bacteria.  Nasal cytology. A sample of mucus is taken from your nose and examined by your health care provider to determine if your sinusitis is related to an allergy. TREATMENT  Most cases of acute sinusitis are related to a viral infection and will resolve on their own within 10 days. Sometimes medicines are prescribed to help relieve symptoms (pain medicine, decongestants, nasal steroid sprays, or saline sprays).  However, for sinusitis related to a bacterial infection, your health care provider will prescribe antibiotic medicines. These are medicines that will help kill the bacteria causing the infection.  Rarely, sinusitis is caused by a fungal infection. In theses cases, your health care provider will prescribe antifungal medicine. For some cases of chronic sinusitis, surgery is needed. Generally, these are cases in which sinusitis recurs more than 3 times per year, despite other treatments. HOME CARE INSTRUCTIONS   Drink plenty of water. Water helps thin the mucus so your sinuses can drain more easily.  Use a humidifier.  Inhale steam 3 to 4 times a day (for example, sit in the bathroom with the shower running).  Apply a warm, moist washcloth to your face 3 to 4 times a day, or as directed by your health care provider.  Use saline nasal sprays to help moisten and clean your sinuses.  Take medicines only as directed by your health care provider.  If you were prescribed either an antibiotic or antifungal medicine, finish it all even if you start to feel better. SEEK IMMEDIATE MEDICAL CARE IF:  You have increasing pain or severe  headaches.  You have nausea, vomiting, or drowsiness.  You have swelling around your face.  You have vision problems.  You have a stiff neck.  You have difficulty breathing. MAKE SURE YOU:   Understand these instructions.  Will watch your condition.  Will get help right away if you are not doing well or get worse. Document Released: 01/28/2005 Document Revised: 06/14/2013 Document Reviewed: 02/12/2011 [facility] Patient Information 2015 [place], [place]. This information is not intended to replace advice given to you by your health care provider. Make sure you discuss any questions you have with your health care provider.

## 2014-11-01 ENCOUNTER — Ambulatory Visit (INDEPENDENT_AMBULATORY_CARE_PROVIDER_SITE_OTHER): Payer: BLUE CROSS/BLUE SHIELD | Admitting: Family Medicine

## 2014-11-01 VITALS — BP 112/76 | HR 68 | Temp 98.3°F | Resp 16 | Ht 61.75 in | Wt 205.8 lb

## 2014-11-01 DIAGNOSIS — Z23 Encounter for immunization: Secondary | ICD-10-CM

## 2014-11-01 DIAGNOSIS — N3000 Acute cystitis without hematuria: Secondary | ICD-10-CM | POA: Diagnosis not present

## 2014-11-01 DIAGNOSIS — Z113 Encounter for screening for infections with a predominantly sexual mode of transmission: Secondary | ICD-10-CM

## 2014-11-01 DIAGNOSIS — R300 Dysuria: Secondary | ICD-10-CM | POA: Diagnosis not present

## 2014-11-01 LAB — POC MICROSCOPIC URINALYSIS (UMFC): Mucus: ABSENT

## 2014-11-01 LAB — POCT URINALYSIS DIP (MANUAL ENTRY)
BILIRUBIN UA: NEGATIVE
Glucose, UA: NEGATIVE
Ketones, POC UA: NEGATIVE
NITRITE UA: NEGATIVE
PH UA: 6
Protein Ur, POC: 100 — AB
Spec Grav, UA: 1.025
UROBILINOGEN UA: 0.2

## 2014-11-01 LAB — POCT URINE PREGNANCY: PREG TEST UR: NEGATIVE

## 2014-11-01 LAB — POCT GLYCOSYLATED HEMOGLOBIN (HGB A1C): HEMOGLOBIN A1C: 6.1

## 2014-11-01 MED ORDER — NITROFURANTOIN MONOHYD MACRO 100 MG PO CAPS: 100.0000 mg | ORAL_CAPSULE | Freq: Two times a day (BID) | ORAL | Status: DC

## 2014-11-01 NOTE — Progress Notes (Signed)
[facility] [address], [place] [address] [phone]  Date:  11/01/2014   Name:  Tammy Butler   DOB:  [birth year]   MRN:  [id number]  PCP:  No PCP Per Patient    Chief Complaint: Dysuria and Menstrual Problem   History of Present Illness:  Tammy Butler is a 29 y.o. very pleasant female patient who presents with the following: - Not sure if UTI or vaginal infection.  - Vaginal pruritis began last week, which resolved since her period began. Discharge white, thick. Not colored.  - Period began spotting soon after that, 10/26/2014. Much less than normal.  Has been irregular, had one last month as well.   - Sexually active, engaged. 1 partner in the last year.  - No STDs in the past.  - No birth control.  Wanting to be pregnant. Did in home UPT, which was negative.   2 days ago also began urinary symptoms: - + urgency & increased frequency (4x within 10 minutes at a time).   - NO burning, just discomfort at end of stream.  - Never has had a UTI before.    - + cloudy urine. - + voiding following intercourse.    - Denies systemic symptoms.   Had normal pap this year with OB.   Patient Active Problem List   Diagnosis Date Noted  . Asthma, chronic 05/04/2014  . Eczema 05/04/2014  . BMI 38.0-38.9,adult 03/21/2014    Past Medical History  Diagnosis Date  . Seasonal allergies   . Allergy   . Asthma     History reviewed. No pertinent past surgical history.  Social History  Substance Use Topics  . Smoking status: Passive Smoke Exposure - Never Smoker  . Smokeless tobacco: Never Used  . Alcohol Use: Yes     Comment: occasional    Family History  Problem Relation Age of Onset  . Hypertension Mother   . Stroke Mother   . Heart disease Mother   . Diabetes Maternal Grandmother     No Known Allergies  Medication list has been reviewed and updated.  Current Outpatient Prescriptions on File Prior to Visit  Medication Sig Dispense  Refill  . mometasone (NASONEX) 50 MCG/ACT nasal spray Place 2 sprays into the nose daily.    Marland Kitchen albuterol (PROVENTIL HFA;VENTOLIN HFA) 108 (90 BASE) MCG/ACT inhaler Inhale 2 puffs into the lungs every 6 (six) hours as needed for wheezing or shortness of breath. (Patient not taking: Reported on 11/01/2014) 1 Inhaler 2   No current facility-administered medications on file prior to visit.    Review of Systems: Review of Systems  Constitutional: Negative for fever, chills and diaphoresis.  Eyes: Negative for double vision.  Respiratory: Negative for cough, shortness of breath and wheezing.   Cardiovascular: Negative for chest pain.  Gastrointestinal: Negative for nausea, vomiting and abdominal pain (cramping, mild).  Genitourinary: Positive for dysuria, urgency and frequency. Negative for flank pain.  Musculoskeletal: Negative for myalgias.  Neurological: Negative for dizziness, weakness and headaches.      Physical Examination: Filed Vitals:   11/01/14 1656  BP: 112/76  Pulse: 68  Temp: 98.3 F (36.8 C)  Resp: 16   Filed Vitals:   11/01/14 1656  Height: 5' 1.75" (1.568 m)  Weight: 205 lb 12.8 oz (93.35 kg)   Body mass index is 37.97 kg/(m^2). Ideal Body Weight: Weight in (lb) to have BMI = 25: 135.3  GEN: WDWN, NAD, Non-toxic, A &  O x 3 HEENT: Atraumatic, Normocephalic. Neck supple. No masses, No LAD. CV: RRR, No M/G/R. No JVD. No thrill. No extra heart sounds. PULM: CTA B, no wheezes, crackles, rhonchi. No retractions. No resp. distress. No accessory muscle use. ABD: S, NT, ND, +BS. No rebound. No HSM. No CVA tenderness.  EXTR: No c/c/e NEURO Normal gait.  PSYCH: Normally interactive. Conversant. Not depressed or anxious appearing.  Calm demeanor.   Assessment and Plan: Dysuria: + leuks, bacteria, nitrite negative. UPT negative.  No signs of pyelo.  Will treat with macrobid  bid x 5 days. F/u with any worsening symptoms.    Obesity: A1c today. BP normal.  F/u 2  months to discuss lifestyle  STD screening: GC/CT with symptoms. Patient agrees to HIV as well.   Well visit: will get flu vaccine today as well.    Signed Guinevere Scarlet, MD

## 2014-11-01 NOTE — Patient Instructions (Addendum)
Urinary Tract Infection °Urinary tract infections (UTIs) can develop anywhere along your urinary tract. Your urinary tract is your body's drainage system for removing wastes and extra water. Your urinary tract includes two kidneys, two ureters, a bladder, and a urethra. Your kidneys are a pair of bean-shaped organs. Each kidney is about the size of your fist. They are located below your ribs, one on each side of your spine. °CAUSES °Infections are caused by microbes, which are microscopic organisms, including fungi, viruses, and bacteria. These organisms are so small that they can only be seen through a microscope. Bacteria are the microbes that most commonly cause UTIs. °SYMPTOMS  °Symptoms of UTIs may vary by age and gender of the patient and by the location of the infection. Symptoms in young women typically include a frequent and intense urge to urinate and a painful, burning feeling in the bladder or urethra during urination. Older women and men are more likely to be tired, shaky, and weak and have muscle aches and abdominal pain. A fever may mean the infection is in your kidneys. Other symptoms of a kidney infection include pain in your back or sides below the ribs, nausea, and vomiting. °DIAGNOSIS °To diagnose a UTI, your caregiver will ask you about your symptoms. Your caregiver also will ask to provide a urine sample. The urine sample will be tested for bacteria and white blood cells. White blood cells are made by your body to help fight infection. °TREATMENT  °Typically, UTIs can be treated with medication. Because most UTIs are caused by a bacterial infection, they usually can be treated with the use of antibiotics. The choice of antibiotic and length of treatment depend on your symptoms and the type of bacteria causing your infection. °HOME CARE INSTRUCTIONS °· If you were prescribed antibiotics, take them exactly as your caregiver instructs you. Finish the medication even if you feel better after you  have only taken some of the medication. °· Drink enough water and fluids to keep your urine clear or pale yellow. °· Avoid caffeine, tea, and carbonated beverages. They tend to irritate your bladder. °· Empty your bladder often. Avoid holding urine for long periods of time. °· Empty your bladder before and after sexual intercourse. °· After a bowel movement, women should cleanse from front to back. Use each tissue only once. °SEEK MEDICAL CARE IF:  °· You have back pain. °· You develop a fever. °· Your symptoms do not begin to resolve within 3 days. °SEEK IMMEDIATE MEDICAL CARE IF:  °· You have severe back pain or lower abdominal pain. °· You develop chills. °· You have nausea or vomiting. °· You have continued burning or discomfort with urination. °MAKE SURE YOU:  °· Understand these instructions. °· Will watch your condition. °· Will get help right away if you are not doing well or get worse. °Document Released: 11/07/2004 Document Revised: 07/30/2011 Document Reviewed: 03/08/2011 °ExitCare® Patient Information ©2015 [employer]. This information is not intended to replace advice given to you by your health care provider. Make sure you discuss any questions you have with your health care provider. ° °Nitrofurantoin tablets or capsules °What is this medicine? °NITROFURANTOIN (nye troe fyoor AN toyn) is an antibiotic. It is used to treat urinary tract infections. °This medicine may be used for other purposes; ask your health care provider or pharmacist if you have questions. °COMMON BRAND NAME(S): Macrobid, Macrodantin, Urotoin °What should I tell my health care provider before I take this medicine? °They need to   know if you have any of these conditions: °-anemia °-diabetes °-glucose-6-phosphate dehydrogenase deficiency °-kidney disease °-liver disease °-lung disease °-other chronic illness °-an unusual or allergic reaction to nitrofurantoin, other antibiotics, other medicines, foods, dyes or  preservatives °-pregnant or trying to get pregnant °-breast-feeding °How should I use this medicine? °Take this medicine by mouth with a glass of water. Follow the directions on the prescription label. Take this medicine with food or milk. Take your doses at regular intervals. Do not take your medicine more often than directed. Do not stop taking except on your doctor's advice. °Talk to your pediatrician regarding the use of this medicine in children. While this drug may be prescribed for selected conditions, precautions do apply. °Overdosage: If you think you have taken too much of this medicine contact a poison control center or emergency room at once. °NOTE: This medicine is only for you. Do not share this medicine with others. °What if I miss a dose? °If you miss a dose, take it as soon as you can. If it is almost time for your next dose, take only that dose. Do not take double or extra doses. °What may interact with this medicine? °-antacids containing magnesium trisilicate °-probenecid °-quinolone antibiotics like ciprofloxacin, lomefloxacin, norfloxacin and ofloxacin °-sulfinpyrazone °This list may not describe all possible interactions. Give your health care provider a list of all the medicines, herbs, non-prescription drugs, or dietary supplements you use. Also tell them if you smoke, drink alcohol, or use illegal drugs. Some items may interact with your medicine. °What should I watch for while using this medicine? °Tell your doctor or health care professional if your symptoms do not improve or if you get new symptoms. Drink several glasses of water a day. If you are taking this medicine for a long time, visit your doctor for regular checks on your progress. °If you are diabetic, you may get a false positive result for sugar in your urine with certain brands of urine tests. Check with your doctor. °What side effects may I notice from receiving this medicine? °Side effects that you should report to your  doctor or health care professional as soon as possible: °-allergic reactions like skin rash or hives, swelling of the face, lips, or tongue °-chest pain °-cough °-difficulty breathing °-dizziness, drowsiness °-fever or infection °-joint aches or pains °-pale or blue-tinted skin °-redness, blistering, peeling or loosening of the skin, including inside the mouth °-tingling, burning, pain, or numbness in hands or feet °-unusual bleeding or bruising °-unusually weak or tired °-yellowing of eyes or skin °Side effects that usually do not require medical attention (report to your doctor or health care professional if they continue or are bothersome): °-dark urine °-diarrhea °-headache °-loss of appetite °-nausea or vomiting °-temporary hair loss °This list may not describe all possible side effects. Call your doctor for medical advice about side effects. You may report side effects to FDA at 1-800-FDA-1088. °Where should I keep my medicine? °Keep out of the reach of children. °Store at room temperature between 15 and 30 degrees C (59 and 86 degrees F). Protect from light. Throw away any unused medicine after the expiration date. °NOTE: This sheet is a summary. It may not cover all possible information. If you have questions about this medicine, talk to your doctor, pharmacist, or health care provider. °© 2015, [employer]vier/[employer]. (2007-08-19 15:56:47) ° °

## 2014-11-02 LAB — LIPID PANEL
CHOLESTEROL: 188 mg/dL (ref 125–200)
HDL: 35 mg/dL — AB (ref >=46)
LDL CALC: 122 mg/dL (ref <130)
TRIGLYCERIDES: 157 mg/dL — AB (ref <150)
Total CHOL/HDL Ratio: 5.4 Ratio — ABNORMAL HIGH (ref <=5.0)
VLDL: 31 mg/dL — AB (ref <30)

## 2014-11-02 LAB — HIV ANTIBODY (ROUTINE TESTING W REFLEX): HIV 1&2 Ab, 4th Generation: NONREACTIVE

## 2014-11-02 NOTE — Progress Notes (Signed)
Patient discussed with Dr. Williams. Agree with assessment and plan of care per his note.   

## 2014-11-03 LAB — GC/CHLAMYDIA PROBE AMP
CT Probe RNA: NEGATIVE
GC PROBE AMP APTIMA: NEGATIVE

## 2014-11-04 LAB — URINE CULTURE

## 2014-11-07 ENCOUNTER — Telehealth: Payer: Self-pay

## 2014-11-07 NOTE — Telephone Encounter (Signed)
Will route this message to Dr. Mayford Knife as he saw patient and precepted with me. Urine culture was positive but sensitive to macrobid that she was prescribed.

## 2014-11-07 NOTE — Telephone Encounter (Signed)
Pt called about labs. Let her know HIV and GC/Chlamydia were neg. Let her know that we would call her with her chol results once you've reviewed them. Urine culture still pending. Called [facility] to check status. They will release the final to EMR

## 2014-11-08 ENCOUNTER — Telehealth: Payer: Self-pay | Admitting: Family Medicine

## 2014-11-08 NOTE — Telephone Encounter (Signed)
Attempted to call patient, but no answer. No answer last week as well. Will discuss elevated A1c when we do speak.

## 2014-11-12 NOTE — Telephone Encounter (Signed)
See labs 

## 2015-04-18 ENCOUNTER — Ambulatory Visit (INDEPENDENT_AMBULATORY_CARE_PROVIDER_SITE_OTHER): Payer: BLUE CROSS/BLUE SHIELD | Admitting: Physician Assistant

## 2015-04-18 VITALS — BP 122/82 | HR 81 | Temp 97.8°F | Resp 16 | Ht 61.0 in | Wt 196.2 lb

## 2015-04-18 DIAGNOSIS — J209 Acute bronchitis, unspecified: Secondary | ICD-10-CM

## 2015-04-18 DIAGNOSIS — R062 Wheezing: Secondary | ICD-10-CM

## 2015-04-18 MED ORDER — IPRATROPIUM BROMIDE 0.02 % IN SOLN
0.5000 mg | Freq: Once | RESPIRATORY_TRACT | Status: AC
  Administered 2015-04-18: 0.5 mg via RESPIRATORY_TRACT

## 2015-04-18 MED ORDER — BENZONATATE 100 MG PO CAPS: 100.0000 mg | ORAL_CAPSULE | Freq: Three times a day (TID) | ORAL | Status: DC | PRN

## 2015-04-18 MED ORDER — ALBUTEROL SULFATE HFA 108 (90 BASE) MCG/ACT IN AERS: 2.0000 | INHALATION_SPRAY | RESPIRATORY_TRACT | Status: DC | PRN

## 2015-04-18 MED ORDER — AZITHROMYCIN 250 MG PO TABS: ORAL_TABLET | ORAL | Status: AC

## 2015-04-18 MED ORDER — ALBUTEROL SULFATE (2.5 MG/3ML) 0.083% IN NEBU
2.5000 mg | INHALATION_SOLUTION | Freq: Once | RESPIRATORY_TRACT | Status: AC
  Administered 2015-04-18: 2.5 mg via RESPIRATORY_TRACT

## 2015-04-18 NOTE — Patient Instructions (Signed)
Drink plenty of water (64 oz/day) and get plenty of rest. Take zpak as directed. Take tessalon three times a day for cough Albuterol as needed for wheezing If your symptoms are not improving in 7-10 days, return to clinic.

## 2015-04-18 NOTE — Progress Notes (Signed)
[facility] [address] [place] [address] [phone]  Date:  04/18/2015   Name:  Tammy Butler   DOB:  03/27/1985   MRN:  [id number]  PCP:  No PCP Per Patient    Chief Complaint: Cough; Chills; Generalized Body Aches; and Nasal Congestion   History of Present Illness:  This is a 30 y.o. female with PMH allergic rhinitis who is presenting with chills, nasal congestion, coughing, sneezing x 3 days. Cough is mostly dry, coughed up some mucus yesterday, cough is a little better today. Chills and body aches have resolved. Temp in 99s 2 days ago. No temp here.  SOB/wheezing: a little sob with exertion. Some wheezing. Sore throat: mild, better today Aggravating/alleviating factors: theraflu and mucinex and ibuprofen History of asthma: no but states she has been rx'd an inhaler in the past during an illness History of env allergies: yes, using nasicort Tobacco use: no, but is exposed to second hand smoke  Review of Systems:  Review of Systems See HPI  Patient Active Problem List   Diagnosis Date Noted  . Asthma, chronic 05/04/2014  . Eczema 05/04/2014  . BMI 38.0-38.9,adult 03/21/2014    Prior to Admission medications   Medication Sig Start Date End Date Taking? Authorizing Provider  mometasone (NASONEX) 50 MCG/ACT nasal spray Place 2 sprays into the nose daily. Reported on 04/18/2015    Historical Provider, MD    No Known Allergies  History reviewed. No pertinent past surgical history.  Social History  Substance Use Topics  . Smoking status: Passive Smoke Exposure - Never Smoker  . Smokeless tobacco: Never Used  . Alcohol Use: Yes     Comment: occasional    Family History  Problem Relation Age of Onset  . Hypertension Mother   . Stroke Mother   . Heart disease Mother   . Diabetes Maternal Grandmother     Medication list has been reviewed and updated.  Physical Examination:  Physical Exam  Constitutional: She is oriented to  person, place, and time. She appears well-developed and well-nourished. No distress.  HENT:  Head: Normocephalic and atraumatic.  Right Ear: Hearing, tympanic membrane, external ear and ear canal normal.  Left Ear: Hearing, tympanic membrane, external ear and ear canal normal.  Nose: Mucosal edema present.  Mouth/Throat: Uvula is midline, oropharynx is clear and moist and mucous membranes are normal.  Eyes: Conjunctivae and lids are normal. Right eye exhibits no discharge. Left eye exhibits no discharge. No scleral icterus.  Cardiovascular: Normal rate, regular rhythm, normal heart sounds and normal pulses.   No murmur heard. Pulmonary/Chest: Effort normal. No respiratory distress. She has no decreased breath sounds. She has wheezes (moderation, throughout). She has rhonchi (throughout). She has no rales.  After duoneb treatment, wheezes resolved. Rhonchi still present throughout, although less.  Musculoskeletal: Normal range of motion.  Lymphadenopathy:       Head (right side): No submental, no submandibular and no tonsillar adenopathy present.       Head (left side): No submental, no submandibular and no tonsillar adenopathy present.    She has no cervical adenopathy.  Neurological: She is alert and oriented to person, place, and time.  Skin: Skin is warm, dry and intact. No lesion and no rash noted.  Psychiatric: She has a normal mood and affect. Her speech is normal and behavior is normal. Thought content normal.   BP 122/82 mmHg  Pulse 81  Temp(Src) 97.8 F (36.6 C) (Oral)  Resp  16  Ht 5\' 1"  (1.549 m)  Wt 196 lb 3.2 oz (88.996 kg)  BMI 37.09 kg/m2  SpO2 99%  LMP 03/28/2015  Assessment and Plan:  1. Wheezing 2. Acute bronchitis Return in 7 days if symptoms do not improve or at any time if symptoms worsen.  - albuterol (PROVENTIL) (2.5 MG/3ML) 0.083% nebulizer solution 2.5 mg; Take 3 mLs (2.5 mg total) by nebulization once. - ipratropium (ATROVENT) nebulizer solution 0.5 mg;  Take 2.5 mLs (0.5 mg total) by nebulization once. - albuterol (PROVENTIL HFA;VENTOLIN HFA) 108 (90 Base) MCG/ACT inhaler; Inhale 2 puffs into the lungs every 4 (four) hours as needed for wheezing or shortness of breath (cough, shortness of breath or wheezing.).  Dispense: 1 Inhaler; Refill: 1 - benzonatate (TESSALON) 100 MG capsule; Take 1-2 capsules (100-200 mg total) by mouth 3 (three) times daily as needed for cough.  Dispense: 40 capsule; Refill: 0 - azithromycin (ZITHROMAX) 250 MG tablet; Take 2 tabs PO x 1 dose, then 1 tab PO QD x 4 days  Dispense: 6 tablet; Refill: 0   [place] MinersNicole V. Dyke BrackettBush, PA-C, MHS [facility]  04/18/2015

## 2015-08-02 ENCOUNTER — Emergency Department (HOSPITAL_COMMUNITY)
Admission: EM | Admit: 2015-08-02 | Discharge: 2015-08-03 | Disposition: A | Discharge status: Home / Self Care | Payer: BLUE CROSS/BLUE SHIELD | Attending: Emergency Medicine | Admitting: Emergency Medicine

## 2015-08-02 ENCOUNTER — Other Ambulatory Visit: Payer: Self-pay

## 2015-08-02 ENCOUNTER — Emergency Department (HOSPITAL_COMMUNITY): Payer: BLUE CROSS/BLUE SHIELD

## 2015-08-02 ENCOUNTER — Encounter (HOSPITAL_COMMUNITY): Payer: Self-pay | Admitting: Emergency Medicine

## 2015-08-02 DIAGNOSIS — Z7722 Contact with and (suspected) exposure to environmental tobacco smoke (acute) (chronic): Secondary | ICD-10-CM | POA: Insufficient documentation

## 2015-08-02 DIAGNOSIS — J069 Acute upper respiratory infection, unspecified: Secondary | ICD-10-CM | POA: Insufficient documentation

## 2015-08-02 DIAGNOSIS — B9789 Other viral agents as the cause of diseases classified elsewhere: Secondary | ICD-10-CM

## 2015-08-02 DIAGNOSIS — J45909 Unspecified asthma, uncomplicated: Secondary | ICD-10-CM | POA: Diagnosis not present

## 2015-08-02 DIAGNOSIS — R0602 Shortness of breath: Secondary | ICD-10-CM | POA: Diagnosis present

## 2015-08-02 LAB — CBC
HEMATOCRIT: 34.9 % — AB (ref 36.0–46.0)
Hemoglobin: 11.6 g/dL — ABNORMAL LOW (ref 12.0–15.0)
MCH: 25.4 pg — ABNORMAL LOW (ref 26.0–34.0)
MCHC: 33.2 g/dL (ref 30.0–36.0)
MCV: 76.4 fL — AB (ref 78.0–100.0)
Platelets: 295 10*3/uL (ref 150–400)
RBC: 4.57 MIL/uL (ref 3.87–5.11)
RDW: 15.6 % — ABNORMAL HIGH (ref 11.5–15.5)
WBC: 11.2 10*3/uL — AB (ref 4.0–10.5)

## 2015-08-02 LAB — BASIC METABOLIC PANEL
Anion gap: 8 (ref 5–15)
BUN: 8 mg/dL (ref 6–20)
CHLORIDE: 105 mmol/L (ref 101–111)
CO2: 25 mmol/L (ref 22–32)
Calcium: 9 mg/dL (ref 8.9–10.3)
Creatinine, Ser: 0.79 mg/dL (ref 0.44–1.00)
GFR calc non Af Amer: >60 mL/min (ref >60)
Glucose, Bld: 115 mg/dL — ABNORMAL HIGH (ref 65–99)
POTASSIUM: 3.4 mmol/L — AB (ref 3.5–5.1)
SODIUM: 138 mmol/L (ref 135–145)

## 2015-08-02 LAB — I-STAT TROPONIN, ED: Troponin i, poc: 0.02 ng/mL (ref 0.00–0.08)

## 2015-08-02 MED ORDER — LORATADINE 10 MG PO TABS
10.0000 mg | ORAL_TABLET | Freq: Once | ORAL | Status: AC
  Administered 2015-08-02: 10 mg via ORAL
  Filled 2015-08-02: qty 1

## 2015-08-02 MED ORDER — METHYLPREDNISOLONE SODIUM SUCC 125 MG IJ SOLR
125.0000 mg | Freq: Once | INTRAMUSCULAR | Status: AC
  Administered 2015-08-02: 125 mg via INTRAVENOUS
  Filled 2015-08-02: qty 2

## 2015-08-02 MED ORDER — IPRATROPIUM BROMIDE 0.02 % IN SOLN
0.5000 mg | Freq: Once | RESPIRATORY_TRACT | Status: AC
  Administered 2015-08-02: 0.5 mg via RESPIRATORY_TRACT
  Filled 2015-08-02: qty 2.5

## 2015-08-02 MED ORDER — ALBUTEROL SULFATE (2.5 MG/3ML) 0.083% IN NEBU
5.0000 mg | INHALATION_SOLUTION | Freq: Once | RESPIRATORY_TRACT | Status: AC
  Administered 2015-08-02: 5 mg via RESPIRATORY_TRACT
  Filled 2015-08-02: qty 6

## 2015-08-02 MED ORDER — ALBUTEROL (5 MG/ML) CONTINUOUS INHALATION SOLN
10.0000 mg/h | INHALATION_SOLUTION | RESPIRATORY_TRACT | Status: AC
  Administered 2015-08-02: 10 mg/h via RESPIRATORY_TRACT
  Filled 2015-08-02: qty 20

## 2015-08-02 MED ORDER — MAGNESIUM SULFATE 2 GM/50ML IV SOLN
2.0000 g | INTRAVENOUS | Status: AC
  Administered 2015-08-02: 2 g via INTRAVENOUS
  Filled 2015-08-02: qty 50

## 2015-08-02 NOTE — ED Notes (Signed)
Pt reports wheezing and sob since yesterday. Also has a cough and generalized body aches. Also reports CP under L breast that is worse with deep breath.

## 2015-08-02 NOTE — ED Provider Notes (Signed)
CSN: [id number]     Arrival date & time 08/02/15  1523 History   First MD Initiated Contact with Patient 08/02/15 1609     Chief Complaint  Patient presents with  . Shortness of Breath  . Wheezing     (Consider location/radiation/quality/duration/timing/severity/associated sxs/prior Treatment) HPI Comments: 30 year old female with a history of asthma and seasonal allergies presents to the emergency department for evaluation of worsening shortness of breath. Patient states that shortness of breath worsened yesterday. She has had associated wheezing. Symptoms preceded by a cough which has been dry and intermittently productive of phlegm. Cough began 2 days ago. She also complains of generalized body aches and intermittent, nonradiating chest pain located under her left breast. Pain is worse with deep inspiration. She describes the pain as sharp in nature. She took a few pumps of an albuterol inhaler PTA without relief. No other medications taken prior to arrival for symptoms. Patient denies history of recent surgeries or hospitalizations. She is not on birth control. She has had no fevers, hemoptysis, leg swelling, syncope.  Patient is a 30 y.o. female presenting with shortness of breath and wheezing. The history is provided by the patient. No language interpreter was used.  Shortness of Breath Associated symptoms: chest pain and wheezing   Associated symptoms: no fever and no vomiting   Wheezing Associated symptoms: chest pain, chest tightness and shortness of breath   Associated symptoms: no fever     Past Medical History  Diagnosis Date  . Seasonal allergies   . Allergy   . Asthma    History reviewed. No pertinent past surgical history. Family History  Problem Relation Age of Onset  . Hypertension Mother   . Stroke Mother   . Heart disease Mother   . Diabetes Maternal Grandmother    Social History  Substance Use Topics  . Smoking status: Passive Smoke Exposure - Never Smoker   . Smokeless tobacco: Never Used  . Alcohol Use: Yes     Comment: occasional   OB History    No data available      Review of Systems  Constitutional: Negative for fever.  HENT: Positive for congestion.   Respiratory: Positive for chest tightness, shortness of breath and wheezing.   Cardiovascular: Positive for chest pain.  Gastrointestinal: Negative for nausea and vomiting.  Neurological: Negative for syncope.  All other systems reviewed and are negative.   Allergies  Review of patient's allergies indicates no known allergies.  Home Medications   Prior to Admission medications   Medication Sig Start Date End Date Taking? Authorizing Provider  albuterol (PROVENTIL HFA;VENTOLIN HFA) 108 (90 Base) MCG/ACT inhaler Inhale 2 puffs into the lungs every 4 (four) hours as needed for wheezing or shortness of breath (cough, shortness of breath or wheezing.). 04/18/15  Yes [place] MinersNicole V Bush, PA-C  mometasone (NASONEX) 50 MCG/ACT nasal spray Place 2 sprays into the nose daily as needed (allergies.). Reported on 04/18/2015   Yes Historical Provider, MD  benzonatate (TESSALON) 100 MG capsule Take 1-2 capsules (100-200 mg total) by mouth 3 (three) times daily as needed for cough. Patient not taking: Reported on 08/02/2015 04/18/15   Dorna LeitzNicole V Bush, PA-C   BP 138/79 mmHg  Pulse 98  Temp(Src) 99.3 F (37.4 C) (Oral)  Resp 20  Ht 5' (1.524 m)  Wt 93.441 kg  BMI 40.23 kg/m2  SpO2 96%  LMP 07/31/2015   Physical Exam  Constitutional: She is oriented to person, place, and time. She appears well-developed and well-nourished.  No distress.  Nontoxic appearing and in NAD  HENT:  Head: Normocephalic and atraumatic.  Eyes: Conjunctivae and EOM are normal. No scleral icterus.  Neck: Normal range of motion.  Cardiovascular: Normal rate, regular rhythm and intact distal pulses.   Pulmonary/Chest: Effort normal. No respiratory distress. She has wheezes. She has no rales.  Diffuse expiratory wheezing, mild.  No rales or rhonchi. Speaking in slightly truncated sentences. No tachypnea; mild dyspnea. Chest expansion symmetric.  Musculoskeletal: Normal range of motion.  Neurological: She is alert and oriented to person, place, and time. She exhibits normal muscle tone. Coordination normal.  GCS 15. Speech is goal oriented.  Skin: Skin is warm and dry. No rash noted. She is not diaphoretic. No erythema. No pallor.  Psychiatric: She has a normal mood and affect. Her behavior is normal.  Nursing note and vitals reviewed.   ED Course  Procedures (including critical care time) Labs Review Labs Reviewed  BASIC METABOLIC PANEL - Abnormal; Notable for the following:    Potassium 3.4 (*)    Glucose, Bld 115 (*)    All other components within normal limits  CBC - Abnormal; Notable for the following:    WBC 11.2 (*)    Hemoglobin 11.6 (*)    HCT 34.9 (*)    MCV 76.4 (*)    MCH 25.4 (*)    RDW 15.6 (*)    All other components within normal limits  D-DIMER, QUANTITATIVE (NOT AT [facility])  Rosezena Sensor, ED    Imaging Review Dg Chest 2 View  08/02/2015  CLINICAL DATA:  Increasing SOB x1 week. No other chest complaints. Hx of asthma. Nonsmoker. EXAM: CHEST - 2 VIEW COMPARISON:  03/21/2014 FINDINGS: Mild central peribronchial thickening. No confluent airspace infiltrate. Heart size normal. No effusion. Regional bones unremarkable. IMPRESSION: Mild central peribronchial thickening suggesting asthma, bronchitis, or viral syndrome. Electronically Signed   By: Corlis Leak M.D.   On: 08/02/2015 17:03   I have personally reviewed and evaluated these images and lab results as part of my medical decision-making.   EKG Interpretation   Date/Time:  Wednesday August 02 2015 16:06:31 EDT Ventricular Rate:  103 PR Interval:    QRS Duration: 80 QT Interval:  322 QTC Calculation: 422 R Axis:   79 Text Interpretation:  Sinus tachycardia Borderline repolarization  abnormality No previous ECGs available Confirmed by  ZACKOWSKI  MD, SCOTT  [phone]) on 08/02/2015 4:15:24 PM       Medications  albuterol (PROVENTIL,VENTOLIN) solution continuous neb (0 mg/hr Nebulization Stopped 08/02/15 2256)  albuterol (PROVENTIL) (2.5 MG/3ML) 0.083% nebulizer solution 5 mg (5 mg Nebulization Given 08/02/15 1615)  albuterol (PROVENTIL) (2.5 MG/3ML) 0.083% nebulizer solution 5 mg (5 mg Nebulization Given 08/02/15 2046)  methylPREDNISolone sodium succinate (SOLU-MEDROL) 125 mg/2 mL injection 125 mg (125 mg Intravenous Given 08/02/15 2154)  magnesium sulfate IVPB 2 g 50 mL (0 g Intravenous Stopped 08/02/15 2255)  ipratropium (ATROVENT) nebulizer solution 0.5 mg (0.5 mg Nebulization Given 08/02/15 2148)  loratadine (CLARITIN) tablet 10 mg (10 mg Oral Given 08/02/15 2155)  albuterol (PROVENTIL HFA;VENTOLIN HFA) 108 (90 Base) MCG/ACT inhaler 2 puff (2 puffs Inhalation Given 08/03/15 0312)    CRITICAL CARE Performed by: Antony Madura   Total critical care time: 35 minutes  Critical care time was exclusive of separately billable procedures and treating other patients.  Critical care was necessary to treat or prevent imminent or life-threatening deterioration.  Critical care was time spent personally by me on the following activities: development of  treatment plan with patient and/or surrogate as well as nursing, discussions with consultants, evaluation of patient's response to treatment, examination of patient, obtaining history from patient or surrogate, ordering and performing treatments and interventions, ordering and review of laboratory studies, ordering and review of radiographic studies, pulse oximetry and re-evaluation of patient's condition.  MDM   Final diagnoses:  Viral URI with cough    Patient presents for cough, wheezing, SOB, and myalgias which began yesterday. She has a hx of similar symptoms for which she has been seen in the ED and urgent care. Laboratory work up is reassuring. Patient low risk for PE with negative  D dimer. CXR negative for acute infiltrate.   Symptoms managed with albuterol neb x 2, CAT, MgSO4, and Solumedrol. Patient feels as though her symptoms have improved with these medications. Her symptoms are consistent with URI, likely viral etiology.  No fever to suggest pneumonia. Discussed that antibiotics are not indicated for viral infections. Patient did have mild hypoxia with ambulation to SpO2 of 89%, but she has had no tachypnea. Lung sounds improved on reexamination; no clear, compared to diffuse wheezes on initial exam. Patient desires discharge. Plan to discharge with symptomatic treatment. She verbalizes understanding and is agreeable with plan. Patient discharged in satisfactory condition with no unaddressed concerns.     Antony Madura, PA-C 08/03/15 0410  Marily Memos, MD 08/07/15 [phone]

## 2015-08-03 LAB — D-DIMER, QUANTITATIVE (NOT AT ARMC)

## 2015-08-03 MED ORDER — ALBUTEROL SULFATE HFA 108 (90 BASE) MCG/ACT IN AERS
2.0000 | INHALATION_SPRAY | Freq: Once | RESPIRATORY_TRACT | Status: AC
  Administered 2015-08-03: 2 via RESPIRATORY_TRACT
  Filled 2015-08-03: qty 6.7

## 2015-08-03 MED ORDER — PREDNISONE 20 MG PO TABS: 40.0000 mg | ORAL_TABLET | Freq: Every day | ORAL | Status: DC

## 2015-08-03 MED ORDER — BENZONATATE 100 MG PO CAPS: 100.0000 mg | ORAL_CAPSULE | Freq: Three times a day (TID) | ORAL | Status: DC | PRN

## 2015-08-03 MED ORDER — MONTELUKAST SODIUM 10 MG PO TABS: 10.0000 mg | ORAL_TABLET | Freq: Every day | ORAL | Status: DC

## 2015-08-03 NOTE — ED Provider Notes (Signed)
Medical screening examination/treatment/procedure(s) were conducted as a shared visit with non-physician practitioner(s) and myself.  I personally evaluated the patient during the encounter.  30 year old female here with possible asthma exacerbation. On my evaluation patient's her to have multiple breathing treatments and other interventions and patient appears well as speaking in full sentences. Slightly tachypneic at 2224. Heart rate ranging from 80s to 109. Recent been ambulating and had a pulse ox of 89% on ablation was not tachypneic per the patient's report. Her lungs are clear without any decreased breath sounds. Secondary to her hypoxia and tachypnea with clear breath sounds will make sure she doesn't have a PE. After this if the patient is still at the level she is right now she can be discharged home with every 4 hour albuterol and a steroid burst.    EKG Interpretation   Date/Time:  Wednesday August 02 2015 16:06:31 EDT Ventricular Rate:  103 PR Interval:    QRS Duration: 80 QT Interval:  322 QTC Calculation: 422 R Axis:   79 Text Interpretation:  Sinus tachycardia Borderline repolarization  abnormality No previous ECGs available Confirmed by Deretha EmoryZACKOWSKI  MD, SCOTT  ([id number]) on 08/02/2015 4:15:24 PM        Marily MemosJason Karyn Brull, MD 08/03/15 [phone]

## 2015-08-03 NOTE — Discharge Instructions (Signed)
Take prednisone and Singulair as prescribed. You may take Tessalon as needed for cough. Use an albuterol inhaler, 2 puffs every 4-6 hours, as needed for wheezing and shortness of breath. Return if symptoms worsen.  Upper Respiratory Infection, Adult Most upper respiratory infections (URIs) are a viral infection of the air passages leading to the lungs. A URI affects the nose, throat, and upper air passages. The most common type of URI is nasopharyngitis and is typically referred to as "the common cold." URIs run their course and usually go away on their own. Most of the time, a URI does not require medical attention, but sometimes a bacterial infection in the upper airways can follow a viral infection. This is called a secondary infection. Sinus and middle ear infections are common types of secondary upper respiratory infections. Bacterial pneumonia can also complicate a URI. A URI can worsen asthma and chronic obstructive pulmonary disease (COPD). Sometimes, these complications can require emergency medical care and may be life threatening.  CAUSES Almost all URIs are caused by viruses. A virus is a type of germ and can spread from one person to another.  RISKS FACTORS You may be at risk for a URI if:   You smoke.   You have chronic heart or lung disease.  You have a weakened defense (immune) system.   You are very young or very old.   You have nasal allergies or asthma.  You work in crowded or poorly ventilated areas.  You work in health care facilities or schools. SIGNS AND SYMPTOMS  Symptoms typically develop 2-3 days after you come in contact with a cold virus. Most viral URIs last 7-10 days. However, viral URIs from the influenza virus (flu virus) can last 14-18 days and are typically more severe. Symptoms may include:   Runny or stuffy (congested) nose.   Sneezing.   Cough.   Sore throat.   Headache.   Fatigue.   Fever.   Loss of appetite.   Pain in your  forehead, behind your eyes, and over your cheekbones (sinus pain).  Muscle aches.  DIAGNOSIS  Your health care provider may diagnose a URI by:  Physical exam.  Tests to check that your symptoms are not due to another condition such as:  Strep throat.  Sinusitis.  Pneumonia.  Asthma. TREATMENT  A URI goes away on its own with time. It cannot be cured with medicines, but medicines may be prescribed or recommended to relieve symptoms. Medicines may help:  Reduce your fever.  Reduce your cough.  Relieve nasal congestion. HOME CARE INSTRUCTIONS   Take medicines only as directed by your health care provider.   Gargle warm saltwater or take cough drops to comfort your throat as directed by your health care provider.  Use a warm mist humidifier or inhale steam from a shower to increase air moisture. This may make it easier to breathe.  Drink enough fluid to keep your urine clear or pale yellow.   Eat soups and other clear broths and maintain good nutrition.   Rest as needed.   Return to work when your temperature has returned to normal or as your health care provider advises. You may need to stay home longer to avoid infecting others. You can also use a face mask and careful hand washing to prevent spread of the virus.  Increase the usage of your inhaler if you have asthma.   Do not use any tobacco products, including cigarettes, chewing tobacco, or electronic cigarettes. If you  need help quitting, ask your health care provider. PREVENTION  The best way to protect yourself from getting a cold is to practice good hygiene.   Avoid oral or hand contact with people with cold symptoms.   Wash your hands often if contact occurs.  There is no clear evidence that vitamin C, vitamin E, echinacea, or exercise reduces the chance of developing a cold. However, it is always recommended to get plenty of rest, exercise, and practice good nutrition.  SEEK MEDICAL CARE IF:   You  are getting worse rather than better.   Your symptoms are not controlled by medicine.   You have chills.  You have worsening shortness of breath.  You have brown or red mucus.  You have yellow or brown nasal discharge.  You have pain in your face, especially when you bend forward.  You have a fever.  You have swollen neck glands.  You have pain while swallowing.  You have white areas in the back of your throat. SEEK IMMEDIATE MEDICAL CARE IF:   You have severe or persistent:  Headache.  Ear pain.  Sinus pain.  Chest pain.  You have chronic lung disease and any of the following:  Wheezing.  Prolonged cough.  Coughing up blood.  A change in your usual mucus.  You have a stiff neck.  You have changes in your:  Vision.  Hearing.  Thinking.  Mood. MAKE SURE YOU:   Understand these instructions.  Will watch your condition.  Will get help right away if you are not doing well or get worse.   This information is not intended to replace advice given to you by your health care provider. Make sure you discuss any questions you have with your health care provider.   Document Released: 07/24/2000 Document Revised: 06/14/2014 Document Reviewed: 05/05/2013 Elsevier Interactive Patient Education 2016 [employer]  [employer] Energy[employer] Mist Vaporizers Vaporizers may help relieve the symptoms of a cough and cold. They add moisture to the air, which helps mucus to become thinner and less sticky. This makes it easier to breathe and cough up secretions. Cool mist vaporizers do not cause serious burns like hot mist vaporizers, which may also be called steamers or humidifiers. Vaporizers have not been proven to help with colds. You should not use a vaporizer if you are allergic to mold. HOME CARE INSTRUCTIONS  Follow the package instructions for the vaporizer.  Do not use anything other than distilled water in the vaporizer.  Do not run the vaporizer all of the time. This can  cause mold or bacteria to grow in the vaporizer.  Clean the vaporizer after each time it is used.  Clean and dry the vaporizer well before storing it.  Stop using the vaporizer if worsening respiratory symptoms develop.   This information is not intended to replace advice given to you by your health care provider. Make sure you discuss any questions you have with your health care provider.   Document Released: 10/26/2003 Document Revised: 02/02/2013 Document Reviewed: 06/17/2012 Elsevier Interactive Patient Education [employer]2016 Elsevier Inc.

## 2015-08-03 NOTE — ED Notes (Signed)
Pulse ox while ambulating 89

## 2015-09-27 ENCOUNTER — Inpatient Hospital Stay (HOSPITAL_COMMUNITY)
Admission: AD | Admit: 2015-09-27 | Discharge: 2015-09-27 | Disposition: A | Discharge status: Home / Self Care | Payer: BLUE CROSS/BLUE SHIELD | Source: Ambulatory Visit | Attending: Obstetrics and Gynecology | Admitting: Obstetrics and Gynecology

## 2015-09-27 ENCOUNTER — Encounter (HOSPITAL_COMMUNITY): Payer: Self-pay | Admitting: *Deleted

## 2015-09-27 DIAGNOSIS — D649 Anemia, unspecified: Secondary | ICD-10-CM | POA: Insufficient documentation

## 2015-09-27 DIAGNOSIS — N939 Abnormal uterine and vaginal bleeding, unspecified: Secondary | ICD-10-CM | POA: Diagnosis not present

## 2015-09-27 HISTORY — DX: Dermatitis, unspecified: L30.9

## 2015-09-27 LAB — CBC WITH DIFFERENTIAL/PLATELET
BASOS ABS: 0 10*3/uL (ref 0.0–0.1)
BASOS PCT: 1 %
Eosinophils Absolute: 0.1 10*3/uL (ref 0.0–0.7)
Eosinophils Relative: 2 %
HEMATOCRIT: 32.9 % — AB (ref 36.0–46.0)
HEMOGLOBIN: 10.9 g/dL — AB (ref 12.0–15.0)
Lymphocytes Relative: 37 %
Lymphs Abs: 2.2 10*3/uL (ref 0.7–4.0)
MCH: 25.3 pg — ABNORMAL LOW (ref 26.0–34.0)
MCHC: 33.1 g/dL (ref 30.0–36.0)
MCV: 76.3 fL — ABNORMAL LOW (ref 78.0–100.0)
Monocytes Absolute: 0.4 10*3/uL (ref 0.1–1.0)
Monocytes Relative: 6 %
NEUTROS ABS: 3.2 10*3/uL (ref 1.7–7.7)
NEUTROS PCT: 54 %
Platelets: 272 10*3/uL (ref 150–400)
RBC: 4.31 MIL/uL (ref 3.87–5.11)
RDW: 16.4 % — ABNORMAL HIGH (ref 11.5–15.5)
WBC: 5.9 10*3/uL (ref 4.0–10.5)

## 2015-09-27 LAB — POCT PREGNANCY, URINE: Preg Test, Ur: NEGATIVE

## 2015-09-27 LAB — URINE MICROSCOPIC-ADD ON: WBC, UA: NONE SEEN WBC/hpf (ref 0–5)

## 2015-09-27 LAB — URINALYSIS, ROUTINE W REFLEX MICROSCOPIC
Bilirubin Urine: NEGATIVE
GLUCOSE, UA: NEGATIVE mg/dL
Ketones, ur: NEGATIVE mg/dL
Leukocytes, UA: NEGATIVE
Nitrite: NEGATIVE
PROTEIN: NEGATIVE mg/dL
SPECIFIC GRAVITY, URINE: 1.025 (ref 1.005–1.030)
pH: 5.5 (ref 5.0–8.0)

## 2015-09-27 NOTE — MAU Note (Signed)
Period has been going on for 2 wks.  Started later than expected.started as light to medium, now is heavy

## 2015-09-27 NOTE — Discharge Instructions (Signed)
Abnormal Uterine Bleeding Abnormal uterine bleeding means bleeding from the vagina that is not your normal menstrual period. This can be:  Bleeding or spotting between periods.  Bleeding after sex (sexual intercourse).  Bleeding that is heavier or more than normal.  Periods that last longer than usual.  Bleeding after menopause. There are many problems that may cause this. Treatment will depend on the cause of the bleeding. Any kind of bleeding that is not normal should be reviewed by your doctor.  HOME CARE Watch your condition for any changes. These actions may lessen any discomfort you are having:  Do not use tampons or douches as told by your doctor.  Change your pads often. You should get regular pelvic exams and Pap tests. Keep all appointments for tests as told by your doctor. GET HELP IF:  You are bleeding for more than 1 week.  You feel dizzy at times. GET HELP RIGHT AWAY IF:   You pass out.  You have to change pads every 15 to 30 minutes.  You have belly pain.  You have a fever.  You become sweaty or weak.  You are passing large blood clots from the vagina.  You feel sick to your stomach (nauseous) and throw up (vomit). MAKE SURE YOU:  Understand these instructions.  Will watch your condition.  Will get help right away if you are not doing well or get worse.   This information is not intended to replace advice given to you by your health care provider. Make sure you discuss any questions you have with your health care provider.   Document Released: 11/25/2008 Document Revised: 02/02/2013 Document Reviewed: 08/27/2012 Elsevier Interactive Patient Education 2016 [employer]  Iron Deficiency Anemia, Adult Anemia is when you have a low number of healthy red blood cells. It is often caused by too little iron. This is called iron deficiency anemia. It may make you tired and short of breath. HOME CARE   Take iron as told by your doctor.  Take vitamins  as told by your doctor.  Eat foods that have iron in them. This includes liver, lean beef, whole-grain bread, eggs, dried fruit, and dark green leafy vegetables. GET HELP RIGHT AWAY IF:  You pass out (faint).  You have chest pain.  You feel sick to your stomach (nauseous) or throw up (vomit).  You get very short of breath with activity.  You are weak.  You have a fast heartbeat.  You start to sweat for no reason.  You become light-headed when getting up from a chair or bed. MAKE SURE YOU:  Understand these instructions.  Will watch your condition.  Will get help right away if you are not doing well or get worse.   This information is not intended to replace advice given to you by your health care provider. Make sure you discuss any questions you have with your health care provider.   Document Released: 03/02/2010 Document Revised: 02/18/2014 Document Reviewed: 10/05/2012 Elsevier Interactive Patient Education [employer]2016 Elsevier Inc.

## 2015-09-27 NOTE — MAU Provider Note (Signed)
History     CSN: [id number]  Arrival date and time: 09/27/15 1554   First Provider Initiated Contact with Patient 09/27/15 1634      Chief Complaint  Patient presents with  . Vaginal Bleeding   HPI [name] is a 30 y.o. who presents to MAU today with complaint of heavy vaginal bleeding off and on x 2 weeks. She has used 3 tampons today. The patient states a long history of irregular periods which are generally q 6-8 weeks, although they usually only last 5 days. She denies abdominal pain, weakness, dizziness, fagitue today. She is sexually active and desires conception. She has been seen in the office for evaluation of irregular cycles previously.   OB History    Gravida Para Term Preterm AB Living   0             SAB TAB Ectopic Multiple Live Births                  Past Medical History:  Diagnosis Date  . Allergy   . Asthma   . Eczema   . Seasonal allergies     Past Surgical History:  Procedure Laterality Date  . NO PAST SURGERIES      Family History  Problem Relation Age of Onset  . Hypertension Mother   . Stroke Mother   . Heart disease Mother   . Diabetes Maternal Grandmother     Social History  Substance Use Topics  . Smoking status: Passive Smoke Exposure - Never Smoker  . Smokeless tobacco: Never Used  . Alcohol use Yes     Comment: occasional    Allergies: No Known Allergies  No prescriptions prior to admission.    Review of Systems  Constitutional: Negative for fever and malaise/fatigue.  Gastrointestinal: Negative for abdominal pain, constipation, diarrhea, nausea and vomiting.  Genitourinary:       + vaginal bleeding  Neurological: Negative for dizziness, loss of consciousness and weakness.   Physical Exam   Blood pressure 131/89, pulse 68, temperature 98.3 F (36.8 C), temperature source Oral, resp. rate 16, weight 208 lb 6.4 oz (94.5 kg), SpO2 100 %, unknown if currently breastfeeding.  Physical Exam  Nursing note and  vitals reviewed. Constitutional: She is oriented to person, place, and time. She appears well-developed and well-nourished. No distress.  HENT:  Head: Normocephalic and atraumatic.  Cardiovascular: Normal rate.   Respiratory: Effort normal.  GI: Soft. She exhibits no distension and no mass. There is no tenderness. There is no rebound and no guarding.  Genitourinary: Uterus is not enlarged and not tender. Cervix exhibits no motion tenderness, no discharge and no friability. Right adnexum displays no mass and no tenderness. Left adnexum displays no mass and no tenderness. There is bleeding (scant blood in the vaginal vault) in the vagina. No vaginal discharge found.  Neurological: She is alert and oriented to person, place, and time.  Skin: Skin is warm and dry. No erythema.  Psychiatric: She has a normal mood and affect.     Results for orders placed or performed during the hospital encounter of 09/27/15 (from the past 24 hour(s))  Urinalysis, Routine w reflex microscopic (not at [facility])     Status: Abnormal   Collection Time: 09/27/15  4:05 PM  Result Value Ref Range   Color, Urine YELLOW YELLOW   APPearance CLEAR CLEAR   Specific Gravity, Urine 1.025 1.005 - 1.030   pH 5.5 5.0 - 8.0   Glucose,  UA NEGATIVE NEGATIVE mg/dL   Hgb urine dipstick LARGE (A) NEGATIVE   Bilirubin Urine NEGATIVE NEGATIVE   Ketones, ur NEGATIVE NEGATIVE mg/dL   Protein, ur NEGATIVE NEGATIVE mg/dL   Nitrite NEGATIVE NEGATIVE   Leukocytes, UA NEGATIVE NEGATIVE  Urine microscopic-add on     Status: Abnormal   Collection Time: 09/27/15  4:05 PM  Result Value Ref Range   Squamous Epithelial / LPF 0-5 (A) NONE SEEN   WBC, UA NONE SEEN 0 - 5 WBC/hpf   RBC / HPF TOO NUMEROUS TO COUNT 0 - 5 RBC/hpf   Bacteria, UA FEW (A) NONE SEEN  Pregnancy, urine POC     Status: None   Collection Time: 09/27/15  4:18 PM  Result Value Ref Range   Preg Test, Ur NEGATIVE NEGATIVE  CBC with Differential/Platelet     Status: Abnormal    Collection Time: 09/27/15  4:50 PM  Result Value Ref Range   WBC 5.9 4.0 - 10.5 K/uL   RBC 4.31 3.87 - 5.11 MIL/uL   Hemoglobin 10.9 (L) 12.0 - 15.0 g/dL   HCT 78.232.9 (L) 95.636.0 - 21.346.0 %   MCV 76.3 (L) 78.0 - 100.0 fL   MCH 25.3 (L) 26.0 - 34.0 pg   MCHC 33.1 30.0 - 36.0 g/dL   RDW 08.616.4 (H) 57.811.5 - 46.915.5 %   Platelets 272 150 - 400 K/uL   Neutrophils Relative % 54 %   Neutro Abs 3.2 1.7 - 7.7 K/uL   Lymphocytes Relative 37 %   Lymphs Abs 2.2 0.7 - 4.0 K/uL   Monocytes Relative 6 %   Monocytes Absolute 0.4 0.1 - 1.0 K/uL   Eosinophils Relative 2 %   Eosinophils Absolute 0.1 0.0 - 0.7 K/uL   Basophils Relative 1 %   Basophils Absolute 0.0 0.0 - 0.1 K/uL     MAU Course  Procedures None  MDM UPT - negative UA, CBC today Discussed with Dr. Normand Sloopillard. Ok for discharge at this time. Patient is stable with minimal bleeding today. She will have someone from the office call her with an appointment for follow-up.  Assessment and Plan  A: Abnormal uterine bleeding Anemia, mild  P: Discharge home Advised to start OTC iron supplements daily Bleeding precautions discussed Patient advised to follow-up with [facility], they will call her with an appointment  Patient may return to MAU as needed or if her condition were to change or worsen   Marny LowensteinJulie N Emmaclaire Switala, PA-C  09/27/2015, 6:09 PM

## 2015-09-27 NOTE — MAU Note (Signed)
C/o of bleeding since August 3rd; bleeding varies between light to heavy; started out brownish and is bright red now; had small clots early on but no clots now; is not on BC; hx of irregular periods; denies any pain;

## 2015-10-02 ENCOUNTER — Encounter: Payer: BLUE CROSS/BLUE SHIELD | Admitting: Obstetrics & Gynecology

## 2017-01-04 IMAGING — CR DG CHEST 2V
2 series · 2 of 2 positions shown · non-contrast
Comparison: 03/21/2014

CLINICAL DATA: Increasing SOB x1 week. No other chest complaints.
Hx of asthma. Nonsmoker.

EXAM:
CHEST - 2 VIEW

[w chest pa]
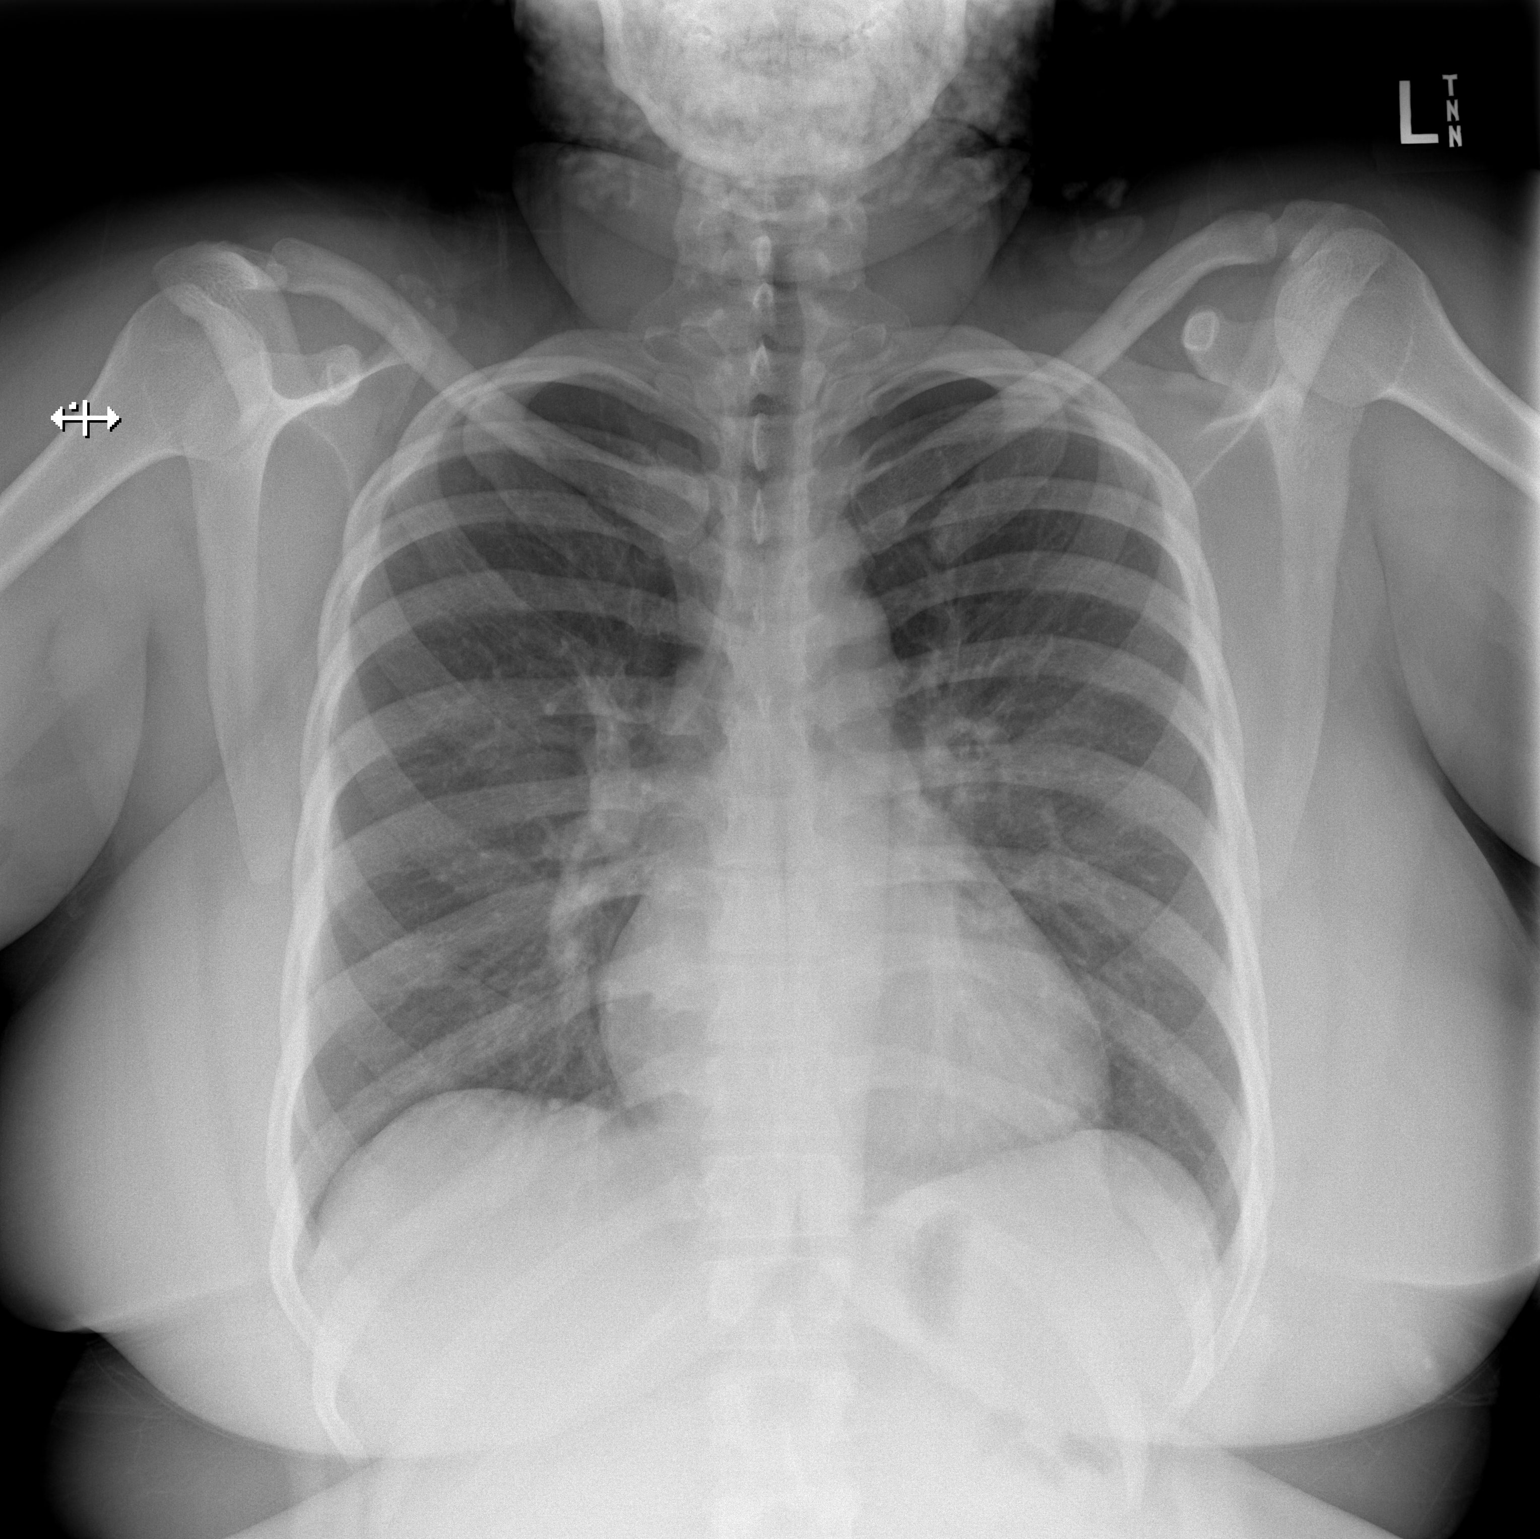

[w chest lat]
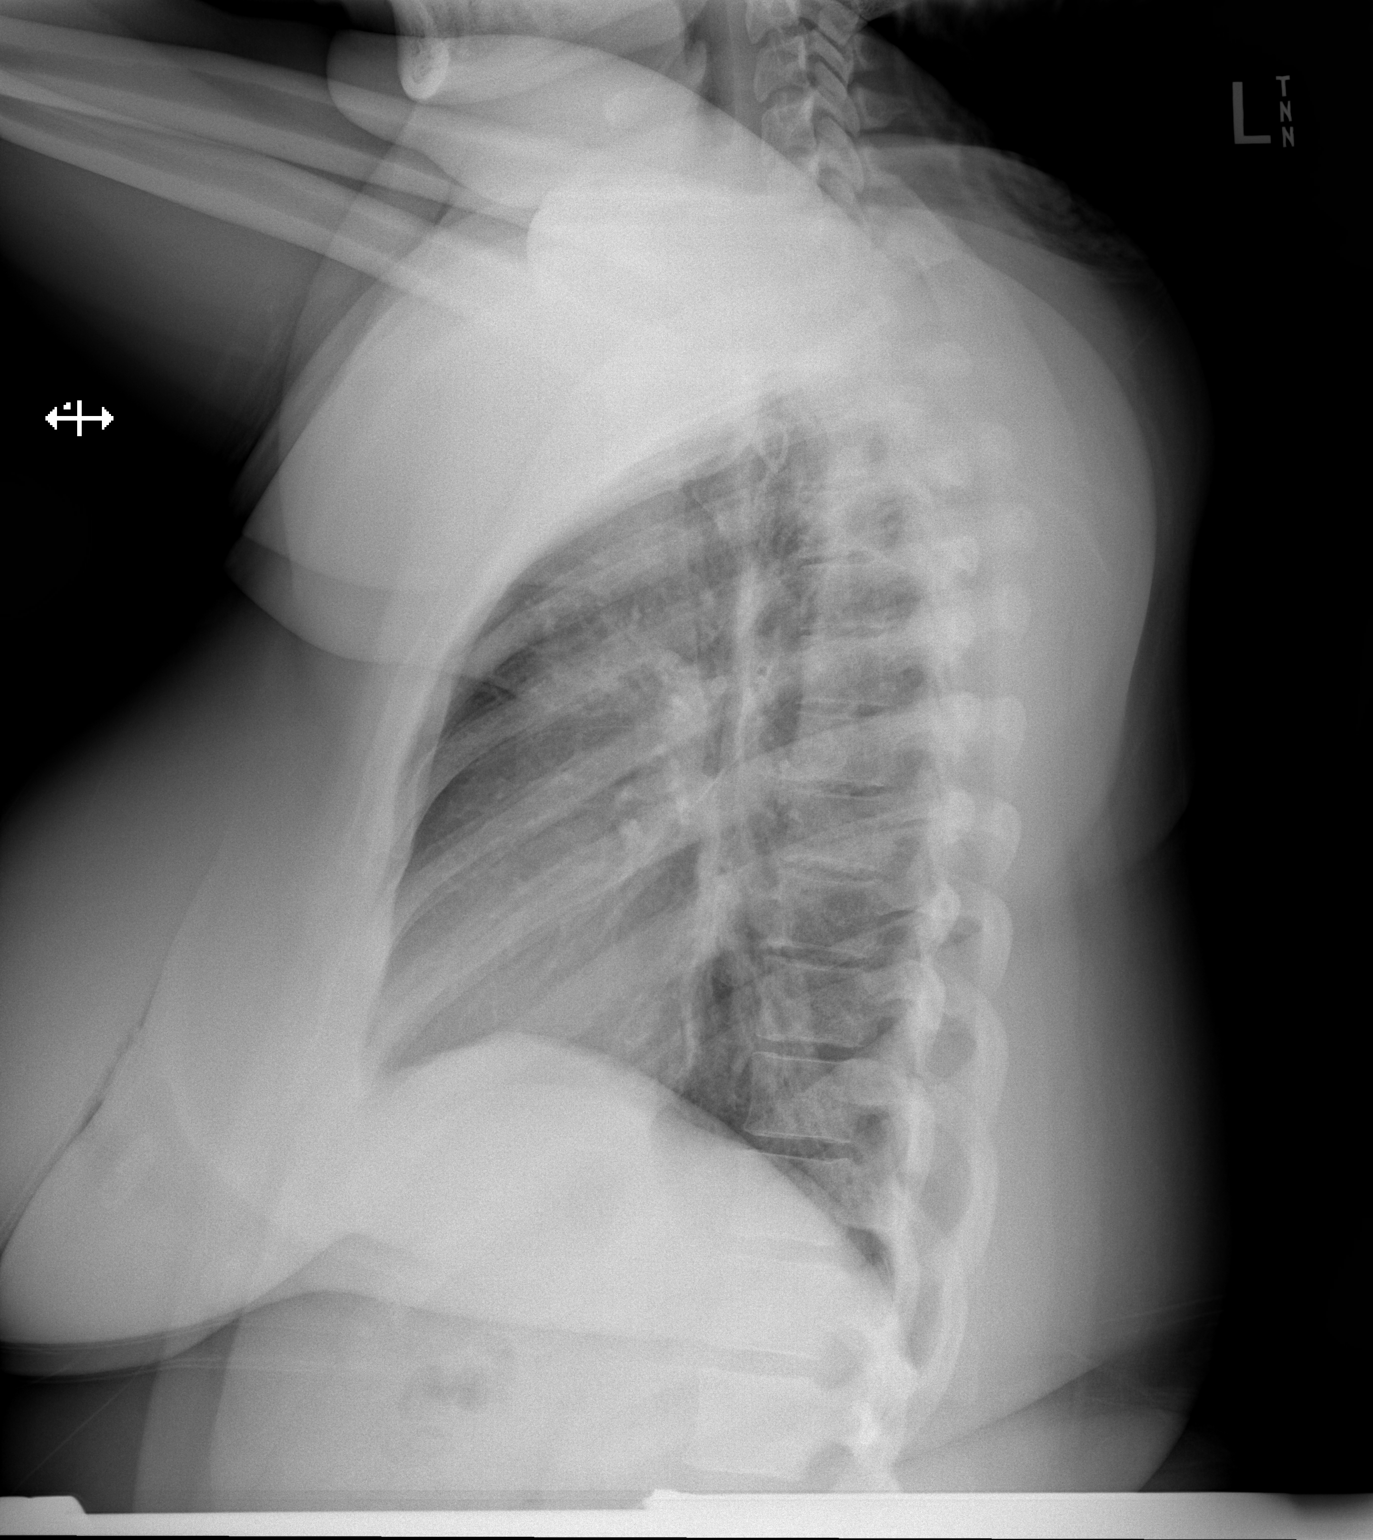

[2 of 2 positions shown; findings below may reference images not displayed]

FINDINGS: Mild central peribronchial thickening. No confluent airspace
infiltrate. Heart size normal. No effusion. Regional bones
unremarkable.
IMPRESSION: Mild central peribronchial thickening suggesting asthma, bronchitis,
or viral syndrome.

## 2017-02-11 NOTE — L&D Delivery Note (Signed)
Delivery Note At  a viable female was delivered via  (Presentation:LOA ;  ).  APGAR:8 ,8 ; weight pending  .   Placenta status: complete, . 3V Cord:  with the following complications:None .  Anesthesia:  Epidural Episiotomy:  None Lacerations:  None Suture Repair: N/A Est. Blood Loss (mL):100cc    Mom to postpartum.  Baby to Couplet care / Skin to Skin.  Henderson Newcomer Yazmin Locher 11/23/2017, 3:40 AM

## 2017-06-06 LAB — OB RESULTS CONSOLE HIV ANTIBODY (ROUTINE TESTING): HIV: NONREACTIVE

## 2017-06-06 LAB — OB RESULTS CONSOLE HEPATITIS B SURFACE ANTIGEN: HEP B S AG: NEGATIVE

## 2017-06-06 LAB — OB RESULTS CONSOLE ANTIBODY SCREEN: ANTIBODY SCREEN: NEGATIVE

## 2017-06-06 LAB — OB RESULTS CONSOLE GC/CHLAMYDIA
Chlamydia: NEGATIVE
Gonorrhea: NEGATIVE

## 2017-06-06 LAB — OB RESULTS CONSOLE RUBELLA ANTIBODY, IGM: RUBELLA: IMMUNE

## 2017-06-06 LAB — OB RESULTS CONSOLE ABO/RH: RH Type: POSITIVE

## 2017-06-06 LAB — OB RESULTS CONSOLE RPR: RPR: NONREACTIVE

## 2017-11-05 LAB — OB RESULTS CONSOLE GBS: GBS: NEGATIVE

## 2017-11-18 ENCOUNTER — Telehealth (HOSPITAL_COMMUNITY): Payer: Self-pay | Admitting: *Deleted

## 2017-11-18 ENCOUNTER — Encounter (HOSPITAL_COMMUNITY): Payer: Self-pay | Admitting: *Deleted

## 2017-11-18 NOTE — Telephone Encounter (Signed)
Preadmission screen  

## 2017-11-21 ENCOUNTER — Encounter (HOSPITAL_COMMUNITY): Payer: Self-pay | Admitting: *Deleted

## 2017-11-21 ENCOUNTER — Other Ambulatory Visit: Payer: Self-pay

## 2017-11-21 ENCOUNTER — Inpatient Hospital Stay (HOSPITAL_COMMUNITY)
Admission: AD | Admit: 2017-11-21 | Discharge: 2017-11-25 | DRG: 807 | Disposition: A | Discharge status: Home / Self Care | Payer: BLUE CROSS/BLUE SHIELD | Attending: Obstetrics & Gynecology | Admitting: Obstetrics & Gynecology

## 2017-11-21 DIAGNOSIS — O139 Gestational [pregnancy-induced] hypertension without significant proteinuria, unspecified trimester: Secondary | ICD-10-CM | POA: Diagnosis present

## 2017-11-21 DIAGNOSIS — J45909 Unspecified asthma, uncomplicated: Secondary | ICD-10-CM | POA: Diagnosis present

## 2017-11-21 DIAGNOSIS — O9952 Diseases of the respiratory system complicating childbirth: Secondary | ICD-10-CM | POA: Diagnosis present

## 2017-11-21 DIAGNOSIS — Z7722 Contact with and (suspected) exposure to environmental tobacco smoke (acute) (chronic): Secondary | ICD-10-CM | POA: Diagnosis present

## 2017-11-21 DIAGNOSIS — Z3A38 38 weeks gestation of pregnancy: Secondary | ICD-10-CM | POA: Diagnosis not present

## 2017-11-21 DIAGNOSIS — O134 Gestational [pregnancy-induced] hypertension without significant proteinuria, complicating childbirth: Secondary | ICD-10-CM | POA: Diagnosis present

## 2017-11-21 DIAGNOSIS — D649 Anemia, unspecified: Secondary | ICD-10-CM | POA: Diagnosis present

## 2017-11-21 DIAGNOSIS — O133 Gestational [pregnancy-induced] hypertension without significant proteinuria, third trimester: Secondary | ICD-10-CM | POA: Diagnosis not present

## 2017-11-21 DIAGNOSIS — O9902 Anemia complicating childbirth: Secondary | ICD-10-CM | POA: Diagnosis present

## 2017-11-21 HISTORY — DX: Anxiety disorder, unspecified: F41.9

## 2017-11-21 HISTORY — DX: Gestational (pregnancy-induced) hypertension without significant proteinuria, unspecified trimester: O13.9

## 2017-11-21 HISTORY — DX: Gastro-esophageal reflux disease without esophagitis: K21.9

## 2017-11-21 LAB — COMPREHENSIVE METABOLIC PANEL
ALBUMIN: 2.8 g/dL — AB (ref 3.5–5.0)
ALT: 18 U/L (ref 0–44)
ANION GAP: 8 (ref 5–15)
AST: 19 U/L (ref 15–41)
Alkaline Phosphatase: 86 U/L (ref 38–126)
BILIRUBIN TOTAL: 0.6 mg/dL (ref 0.3–1.2)
BUN: 9 mg/dL (ref 6–20)
CHLORIDE: 105 mmol/L (ref 98–111)
CO2: 22 mmol/L (ref 22–32)
Calcium: 8.4 mg/dL — ABNORMAL LOW (ref 8.9–10.3)
Creatinine, Ser: 0.64 mg/dL (ref 0.44–1.00)
GFR calc Af Amer: >60 mL/min (ref >60)
GFR calc non Af Amer: >60 mL/min (ref >60)
GLUCOSE: 91 mg/dL (ref 70–99)
Potassium: 4 mmol/L (ref 3.5–5.1)
Sodium: 135 mmol/L (ref 135–145)
TOTAL PROTEIN: 5.7 g/dL — AB (ref 6.5–8.1)

## 2017-11-21 LAB — CBC
HCT: 30.8 % — ABNORMAL LOW (ref 36.0–46.0)
HEMOGLOBIN: 10 g/dL — AB (ref 12.0–15.0)
MCH: 26.8 pg (ref 26.0–34.0)
MCHC: 32.5 g/dL (ref 30.0–36.0)
MCV: 82.6 fL (ref 80.0–100.0)
Platelets: 200 10*3/uL (ref 150–400)
RBC: 3.73 MIL/uL — ABNORMAL LOW (ref 3.87–5.11)
RDW: 17 % — AB (ref 11.5–15.5)
WBC: 6.7 10*3/uL (ref 4.0–10.5)
nRBC: 0 % (ref 0.0–0.2)

## 2017-11-21 LAB — PROTEIN / CREATININE RATIO, URINE
Creatinine, Urine: 82 mg/dL
Protein Creatinine Ratio: 0.2 mg/mg{Cre} — ABNORMAL HIGH (ref 0.00–0.15)
Total Protein, Urine: 16 mg/dL

## 2017-11-21 LAB — TYPE AND SCREEN
ABO/RH(D): O POS
Antibody Screen: NEGATIVE

## 2017-11-21 MED ORDER — LABETALOL HCL 5 MG/ML IV SOLN
80.0000 mg | INTRAVENOUS | Status: DC | PRN
  Administered 2017-11-22: 80 mg via INTRAVENOUS
  Filled 2017-11-21: qty 16

## 2017-11-21 MED ORDER — LABETALOL HCL 5 MG/ML IV SOLN
INTRAVENOUS | Status: AC
  Administered 2017-11-21: 20 mg via INTRAVENOUS
  Filled 2017-11-21: qty 4

## 2017-11-21 MED ORDER — HYDRALAZINE HCL 20 MG/ML IJ SOLN: 10.0000 mg | INTRAMUSCULAR | Status: DC | PRN

## 2017-11-21 MED ORDER — OXYCODONE-ACETAMINOPHEN 5-325 MG PO TABS: 1.0000 | ORAL_TABLET | ORAL | Status: DC | PRN

## 2017-11-21 MED ORDER — ONDANSETRON HCL 4 MG/2ML IJ SOLN: 4.0000 mg | Freq: Four times a day (QID) | INTRAMUSCULAR | Status: DC | PRN

## 2017-11-21 MED ORDER — SOD CITRATE-CITRIC ACID 500-334 MG/5ML PO SOLN: 30.0000 mL | ORAL | Status: DC | PRN

## 2017-11-21 MED ORDER — OXYTOCIN BOLUS FROM INFUSION
500.0000 mL | Freq: Once | INTRAVENOUS | Status: AC
  Administered 2017-11-23: 500 mL via INTRAVENOUS

## 2017-11-21 MED ORDER — LABETALOL HCL 5 MG/ML IV SOLN
40.0000 mg | INTRAVENOUS | Status: DC | PRN
  Administered 2017-11-21 – 2017-11-22 (×2): 40 mg via INTRAVENOUS
  Filled 2017-11-21 (×2): qty 8

## 2017-11-21 MED ORDER — ACETAMINOPHEN 325 MG PO TABS: 650.0000 mg | ORAL_TABLET | ORAL | Status: DC | PRN

## 2017-11-21 MED ORDER — OXYTOCIN 40 UNITS IN LACTATED RINGERS INFUSION - SIMPLE MED
2.5000 [IU]/h | INTRAVENOUS | Status: DC
  Administered 2017-11-23: 2.5 [IU]/h via INTRAVENOUS

## 2017-11-21 MED ORDER — MISOPROSTOL 25 MCG QUARTER TABLET
25.0000 ug | ORAL_TABLET | ORAL | Status: DC | PRN
  Administered 2017-11-21: 25 ug via VAGINAL
  Filled 2017-11-21: qty 1

## 2017-11-21 MED ORDER — TERBUTALINE SULFATE 1 MG/ML IJ SOLN: 0.2500 mg | Freq: Once | INTRAMUSCULAR | Status: DC | PRN

## 2017-11-21 MED ORDER — OXYCODONE-ACETAMINOPHEN 5-325 MG PO TABS: 2.0000 | ORAL_TABLET | ORAL | Status: DC | PRN

## 2017-11-21 MED ORDER — LACTATED RINGERS IV SOLN: 500.0000 mL | INTRAVENOUS | Status: DC | PRN

## 2017-11-21 MED ORDER — OXYTOCIN 40 UNITS IN LACTATED RINGERS INFUSION - SIMPLE MED
1.0000 m[IU]/min | INTRAVENOUS | Status: DC
  Filled 2017-11-21: qty 1000

## 2017-11-21 MED ORDER — LIDOCAINE HCL (PF) 1 % IJ SOLN
30.0000 mL | INTRAMUSCULAR | Status: DC | PRN
  Filled 2017-11-21: qty 30

## 2017-11-21 MED ORDER — LACTATED RINGERS IV SOLN
INTRAVENOUS | Status: DC
  Administered 2017-11-21 – 2017-11-22 (×2): via INTRAVENOUS

## 2017-11-21 MED ORDER — LABETALOL HCL 5 MG/ML IV SOLN
20.0000 mg | INTRAVENOUS | Status: DC | PRN
  Administered 2017-11-21 – 2017-11-22 (×2): 20 mg via INTRAVENOUS
  Filled 2017-11-21: qty 4

## 2017-11-21 MED ORDER — FENTANYL CITRATE (PF) 100 MCG/2ML IJ SOLN
100.0000 ug | INTRAMUSCULAR | Status: DC | PRN
  Administered 2017-11-22: 100 ug via INTRAVENOUS
  Filled 2017-11-21: qty 2

## 2017-11-21 NOTE — H&P (Addendum)
Tammy Butler is a 32 y.o. female, G1P0 at 38.5 weeks, presenting for induction for .preclampsia.  Pregnancy hx of anemia, asthma, increased BMI  Patient Active Problem List   Diagnosis Date Noted  . Asthma, chronic 05/04/2014  . Eczema 05/04/2014  . BMI 38.0-38.9,adult 03/21/2014    History of present pregnancy: Patient entered care at 15 weeks.   EDC of 11/30/2017 was established by LMP Anatomy scan:  20 weeks, with normal findings and an  placenta.   Additional [place] evaluations:  36 weeks 30% AFI 12.3 Significant prenatal events:    Last evaluation: today  OB History    Gravida  1   Para      Term      Preterm      AB      Living        SAB      TAB      Ectopic      Multiple      Live Births             Past Medical History:  Diagnosis Date  . Allergy   . Anemia   . Asthma   . Eczema   . Seasonal allergies    Past Surgical History:  Procedure Laterality Date  . NO PAST SURGERIES     Family History: family history includes Diabetes in her maternal grandmother; Heart disease in her mother; Hypertension in her mother; Stroke in her mother. Social History:  reports that she is a non-smoker but has been exposed to tobacco smoke. She has never used smokeless tobacco. She reports that she drank alcohol. She reports that she does not use drugs.   Prenatal Transfer Tool  Maternal Diabetes: No Genetic Screening: Normal Maternal Ultrasounds/Referrals: Normal Fetal Ultrasounds or other Referrals:  None Maternal Substance Abuse:  No Significant Maternal Medications:  None Significant Maternal Lab Results: None  TDAP Yes Flu No  ROS: Constitutional: Negative for activity change, appetite change and fever.  Respiratory: Negative for shortness of breath.   Cardiovascular: Negative for chest pain.  Gastrointestinal: Negative for abdominal pain.  Genitourinary: Negative for dysuria and vaginal bleeding.   Allergies  Allergen Reactions  . Other     Nuts unspecified       Blood pressure 140/90, pulse 69, temperature 98.5 F (36.9 C), temperature source Oral, resp. rate 16, height 5' (1.524 m), weight 103.9 kg, last menstrual period 02/13/2017, SpO2 100 %, unknown if currently breastfeeding.  Constitutional: She is oriented to person, place, and time. She appears well-developed and well-nourished. No distress.  HENT:  Head: Normocephalic and atraumatic.  Eyes: Conjunctivae and EOM are normal.  Neck: Normal range of motion. Neck supple.  Cardiovascular: Normal rate, regular rhythm and normal heart sounds.  No murmur heard. Respiratory: Effort normal and breath sounds normal.  GI: Soft. There is no tenderness. There is no rebound and no guarding.  Gravid term uterus.   Musculoskeletal: Normal range of motion.  Trace edema to ankles bilaterally.   Neurological: She is alert and oriented to person, place, and time. She displays normal reflexes. She exhibits normal muscle tone.  Skin: Skin is warm and dry.  Psychiatric: She has a normal mood and affect. Her behavior is normal.   FHT: 120 bpm baseline, mod variability, +acels, no decels  Toco: occasional   Vertex presentation confirmed with bedside sono, cervical exam deferred. Chest clear Heart RRR without murmur Prenatal labs: ABO, Rh: O/Positive/-- (04/26 0000) Antibody: Negative (04/26 0000) Rubella:  Immune (04/26 0000) RPR: Nonreactive (04/26 0000)  HBsAg: Negative (04/26 0000)  HIV: Non-reactive (04/26 0000)  GBS: Negative (09/25 0000) Sickle cell/Hgb electrophoresis: AA Pap:  2017 wnl GC:  Neg Chlamydia: Neg Genetic screenings:  Neg Glucola: Hgb A1C 5.8 Other:   Hgb 10.6 at NOB, 9.4 at 28 weeks       Assessment/Plan: IUP at 38.5 IUP induction for gestational hypertension Cat 1 strip  Plan: Admit to Birthing Suite  Routine [facility] orders Pain med/epidural prn Cytotec for induction   Henderson Newcomer ProtheroCNM, MSN 11/21/2017, 7:22 PM

## 2017-11-21 NOTE — MAU Note (Signed)
Pt in office today, elevated b/p , denies headache or blurred vision.

## 2017-11-21 NOTE — MAU Provider Note (Signed)
History     CSN: [id number]  Arrival date and time: 11/21/17 1721   First Provider Initiated Contact with Patient 11/21/17 1809      Chief Complaint  Patient presents with  . Hypertension   HPI   Tammy Butler is a 32 y.o. G1P0 female at [redacted]w[redacted]d who presents to MAU for evaluation of elevated BPs. Seen at [facility] today and noted to have elevated BPs. At presentation, two severe range BPs. Patient denies headaches, blurry vision, RUQ pain. Minimal LE edema. One isolated elevated BP a couple weeks ago with normal blood work; otherwise no elevated BPs this pregnancy per patient or prior history of cHTN.    Reports good FM. No ctx. No LOF or vaginal bleeding.   OB History    Gravida  1   Para      Term      Preterm      AB      Living        SAB      TAB      Ectopic      Multiple      Live Births              Past Medical History:  Diagnosis Date  . Allergy   . Anemia   . Asthma   . Eczema   . Seasonal allergies     Past Surgical History:  Procedure Laterality Date  . NO PAST SURGERIES      Family History  Problem Relation Age of Onset  . Hypertension Mother   . Stroke Mother   . Heart disease Mother   . Diabetes Maternal Grandmother     Social History   Tobacco Use  . Smoking status: Passive Smoke Exposure - Never Smoker  . Smokeless tobacco: Never Used  Substance Use Topics  . Alcohol use: Not Currently    Comment: occasional  . Drug use: No    Allergies:  Allergies  Allergen Reactions  . Other     Nuts unspecified    Medications Prior to Admission  Medication Sig Dispense Refill Last Dose  . albuterol (PROVENTIL HFA;VENTOLIN HFA) 108 (90 Base) MCG/ACT inhaler Inhale 2 puffs into the lungs every 4 (four) hours as needed for wheezing or shortness of breath (cough, shortness of breath or wheezing.). 1 Inhaler 1 rescue  . hydrocortisone butyrate (LUCOID) 0.1 % CREA cream Apply 1 application topically 2 (two) times daily.    09/27/2015 at Unknown time  . mometasone (NASONEX) 50 MCG/ACT nasal spray Place 2 sprays into the nose daily as needed (allergies.). Reported on 04/18/2015   Not Taking at Unknown time  . Prenatal Vit-Fe Fumarate-FA (PRENATAL MULTIVITAMIN) TABS tablet Take 1 tablet by mouth daily at 12 noon.   Past Week at Unknown time    Review of Systems  Constitutional: Negative for activity change, appetite change and fever.  Respiratory: Negative for shortness of breath.   Cardiovascular: Negative for chest pain.  Gastrointestinal: Negative for abdominal pain.  Genitourinary: Negative for dysuria and vaginal bleeding.   Physical Exam   Blood pressure (!) 160/107, pulse 73, temperature 98.5 F (36.9 C), temperature source Oral, resp. rate 16, height 5' (1.524 m), weight 103.9 kg, last menstrual period 02/13/2017, SpO2 100 %, unknown if currently breastfeeding.  Physical Exam  Constitutional: She is oriented to person, place, and time. She appears well-developed and well-nourished. No distress.  HENT:  Head: Normocephalic and atraumatic.  Eyes: Conjunctivae and EOM are normal.  Neck: Normal range of motion. Neck supple.  Cardiovascular: Normal rate, regular rhythm and normal heart sounds.  No murmur heard. Respiratory: Effort normal and breath sounds normal.  GI: Soft. There is no tenderness. There is no rebound and no guarding.  Gravid term uterus.   Musculoskeletal: Normal range of motion.  Trace edema to ankles bilaterally.   Neurological: She is alert and oriented to person, place, and time. She displays normal reflexes. She exhibits normal muscle tone.  Skin: Skin is warm and dry.  Psychiatric: She has a normal mood and affect. Her behavior is normal.   FHT: 120 bpm baseline, mod variability, +acels, no decels  Toco: occasional   Vertex presentation confirmed with bedside sono, cervical exam deferred.   MAU Course  Procedures  MDM Patient presenting with two severe range BPs. Labetalol  protocol initiated. NST reactive.   Assessment and Plan    1. Gestational hypertension, third trimester   Patient with new onset gHTN with severe range pressures. Warrants admission for IOL due to elevated BP and term GA. Discussed with Dr. Sallye Ober, who agrees. Labetalol protocol initiated. PIH labs pending. Admit to birthing suites.    De Hollingshead 11/21/2017, 6:10 PM

## 2017-11-22 ENCOUNTER — Inpatient Hospital Stay (HOSPITAL_COMMUNITY): Payer: BLUE CROSS/BLUE SHIELD | Admitting: Anesthesiology

## 2017-11-22 LAB — ABO/RH: ABO/RH(D): O POS

## 2017-11-22 LAB — CBC
HEMATOCRIT: 30.9 % — AB (ref 36.0–46.0)
Hemoglobin: 10.2 g/dL — ABNORMAL LOW (ref 12.0–15.0)
MCH: 26.8 pg (ref 26.0–34.0)
MCHC: 33 g/dL (ref 30.0–36.0)
MCV: 81.1 fL (ref 80.0–100.0)
NRBC: 0 % (ref 0.0–0.2)
Platelets: 186 10*3/uL (ref 150–400)
RBC: 3.81 MIL/uL — AB (ref 3.87–5.11)
RDW: 16.7 % — AB (ref 11.5–15.5)
WBC: 6 10*3/uL (ref 4.0–10.5)

## 2017-11-22 LAB — RPR: RPR Ser Ql: NONREACTIVE

## 2017-11-22 MED ORDER — MISOPROSTOL 50MCG HALF TABLET
50.0000 ug | ORAL_TABLET | ORAL | Status: DC | PRN
  Administered 2017-11-22 (×2): 50 ug via ORAL
  Filled 2017-11-22 (×3): qty 1

## 2017-11-22 MED ORDER — DIPHENHYDRAMINE HCL 50 MG/ML IJ SOLN: 12.5000 mg | INTRAMUSCULAR | Status: DC | PRN

## 2017-11-22 MED ORDER — FENTANYL 2.5 MCG/ML BUPIVACAINE 1/10 % EPIDURAL INFUSION (WH - ANES)
INTRAMUSCULAR | Status: AC
  Filled 2017-11-22: qty 100

## 2017-11-22 MED ORDER — MAGNESIUM SULFATE 40 G IN LACTATED RINGERS - SIMPLE
2.0000 g/h | INTRAVENOUS | Status: DC
  Administered 2017-11-22 – 2017-11-23 (×2): 2 g/h via INTRAVENOUS
  Filled 2017-11-22 (×2): qty 500

## 2017-11-22 MED ORDER — PHENYLEPHRINE 40 MCG/ML (10ML) SYRINGE FOR IV PUSH (FOR BLOOD PRESSURE SUPPORT): 80.0000 ug | PREFILLED_SYRINGE | INTRAVENOUS | Status: DC | PRN

## 2017-11-22 MED ORDER — LIDOCAINE HCL (PF) 1 % IJ SOLN
INTRAMUSCULAR | Status: DC | PRN
  Administered 2017-11-22: 5 mL via EPIDURAL

## 2017-11-22 MED ORDER — PHENYLEPHRINE 40 MCG/ML (10ML) SYRINGE FOR IV PUSH (FOR BLOOD PRESSURE SUPPORT)
80.0000 ug | PREFILLED_SYRINGE | INTRAVENOUS | Status: DC | PRN
  Filled 2017-11-22: qty 5

## 2017-11-22 MED ORDER — OXYTOCIN 40 UNITS IN LACTATED RINGERS INFUSION - SIMPLE MED: 1.0000 m[IU]/min | INTRAVENOUS | Status: DC

## 2017-11-22 MED ORDER — FENTANYL 2.5 MCG/ML BUPIVACAINE 1/10 % EPIDURAL INFUSION (WH - ANES)
14.0000 mL/h | INTRAMUSCULAR | Status: DC | PRN
  Administered 2017-11-22 (×2): 14 mL/h via EPIDURAL
  Filled 2017-11-22: qty 100

## 2017-11-22 MED ORDER — EPHEDRINE 5 MG/ML INJ
10.0000 mg | INTRAVENOUS | Status: DC | PRN
  Filled 2017-11-22: qty 2

## 2017-11-22 MED ORDER — MAGNESIUM SULFATE BOLUS VIA INFUSION
4.0000 g | Freq: Once | INTRAVENOUS | Status: AC
  Administered 2017-11-22: 4 g via INTRAVENOUS
  Filled 2017-11-22: qty 500

## 2017-11-22 MED ORDER — PHENYLEPHRINE 40 MCG/ML (10ML) SYRINGE FOR IV PUSH (FOR BLOOD PRESSURE SUPPORT)
PREFILLED_SYRINGE | INTRAVENOUS | Status: AC
  Filled 2017-11-22: qty 20

## 2017-11-22 MED ORDER — LACTATED RINGERS IV SOLN: 500.0000 mL | Freq: Once | INTRAVENOUS | Status: DC

## 2017-11-22 MED ORDER — LACTATED RINGERS IV SOLN
500.0000 mL | Freq: Once | INTRAVENOUS | Status: AC
  Administered 2017-11-22: 500 mL via INTRAVENOUS

## 2017-11-22 MED ORDER — EPHEDRINE 5 MG/ML INJ: 10.0000 mg | INTRAVENOUS | Status: DC | PRN

## 2017-11-22 MED ORDER — OXYTOCIN 40 UNITS IN LACTATED RINGERS INFUSION - SIMPLE MED
1.0000 m[IU]/min | INTRAVENOUS | Status: DC
  Administered 2017-11-22: 1 m[IU]/min via INTRAVENOUS

## 2017-11-22 NOTE — Progress Notes (Addendum)
Tammy Butler is a 32 y.o. G1P0 at [redacted]w[redacted]d admitted for induction of labor due to Hypertension.  Subjective: Patient comfortable with epidural. Discussed AROM and IUPC including risks and benefits, patient consented. AROM clear and IUPC placed as we were having difficulty tracing contractions.   Patient with uterine tachysystole and higher resting tone, cut pitocin by 1/2 down to 35mu/min.   Objective: Vitals:   11/22/17 1501 11/22/17 1530 11/22/17 1545 11/22/17 1602  BP: (!) 146/85 (!) 165/99 (!) 173/98 (!) 164/107  Pulse: (!) 57 (!) 57 60 (!) 56  Resp: 15 18 15 15   Temp:  98 F (36.7 C)    TempSrc:  Oral    SpO2: 100% 99% 99% 100%  Weight:      Height:       FHT:  FHR: 125 bpm, variability: moderate,  accelerations:  Present,  decelerations:  Present occasional mild variables UC:   irregular, every 2-4 minutes SVE:   Dilation: 5 Effacement (%): 70 Station: -2 Exam by:: Tammy Butler, RNC  Labs: Lab Results  Component Value Date   WBC 6.0 11/22/2017   HGB 10.2 (L) 11/22/2017   HCT 30.9 (L) 11/22/2017   MCV 81.1 11/22/2017   PLT 186 11/22/2017    Assessment / Plan: Induction of labor due to gestational hypertension,  progressing well on pitocin  Labor: Progressing normally Preeclampsia:  no signs or symptoms of toxicity, intake and ouput balanced and labs stable Fetal Wellbeing:  Category II but overall reassuring  Pain Control:  Labor support without medications I/D:  n/a Anticipated MOD:  NSVD  Tammy Butler 11/22/2017, 4:14 PM   Consulted Dr. Sallye Butler about patient's strip after cutting pitocin by half did not improve contraction pattern. Per Dr. Sallye Butler reduce pitocin to 34mu/min and continue to monitor.   Patient has necessitated 3x doses of IV labetalol per protocol since Epidural placed. Reviewed this with Dr. Sallye Butler, per Dr. Sallye Butler will start MgSO4 on patient now. Patient denies symptoms of preeclampsia: headache, vision changes, epigastric pain.   Tammy Butler   11/22/17 4:14 PM

## 2017-11-22 NOTE — Progress Notes (Signed)
Subjective: Pt resting.    Objective: BP 129/78   Pulse 83   Temp 98.2 F (36.8 C) (Oral)   Resp 18   Ht 5' (1.524 m)   Wt 103.9 kg   LMP 02/13/2017   SpO2 98%   BMI 44.72 kg/m  I/O last 3 completed shifts: In: 1797.2 [I.V.:1797.2] Out: 375 [Urine:375] Total I/O In: 225.8 [P.O.:20; I.V.:205.8] Out: 450 [Urine:450]  FHT: Category 1 FHT 125 accels UC:   regular, every 3 minutes SVE:   Dilation: 5 Effacement (%): 100 Station: -3 Exam by:: Casey Burkitt, RN Pitocin at 2mu MVUs 220  Assessment:  Pt is a G1P0 at 38.6 IUP active labor Cat 2 strip reassuring AROM x 7 hours  Plan: Monitor labor curve  Kenney Houseman CNM, MSN 11/22/2017, 11:29 PM

## 2017-11-22 NOTE — Anesthesia Procedure Notes (Signed)
Epidural Patient location during procedure: OB Start time: 11/22/2017 2:26 PM End time: 11/22/2017 2:32 PM  Staffing Anesthesiologist: Shelton Silvas, MD Performed: anesthesiologist   Preanesthetic Checklist Completed: patient identified, site marked, surgical consent, pre-op evaluation, timeout performed, IV checked, risks and benefits discussed and monitors and equipment checked  Epidural Patient position: sitting Prep: ChloraPrep Patient monitoring: heart rate, continuous pulse ox and blood pressure Approach: midline Location: L3-L4 Injection technique: LOR saline  Needle:  Needle type: Tuohy  Needle gauge: 17 G Needle length: 9 cm Catheter type: closed end flexible Catheter size: 20 Guage Test dose: negative and 1.5% lidocaine  Assessment Events: blood not aspirated, injection not painful, no injection resistance and no paresthesia  Additional Notes LOR @ 6.5  Patient identified. Risks/Benefits/Options discussed with patient including but not limited to bleeding, infection, nerve damage, paralysis, failed block, incomplete pain control, headache, blood pressure changes, nausea, vomiting, reactions to medications, itching and postpartum back pain. Confirmed with bedside nurse the patient's most recent platelet count. Confirmed with patient that they are not currently taking any anticoagulation, have any bleeding history or any family history of bleeding disorders. Patient expressed understanding and wished to proceed. All questions were answered. Sterile technique was used throughout the entire procedure. Please see nursing notes for vital signs. Test dose was given through epidural catheter and negative prior to continuing to dose epidural or start infusion. Warning signs of high block given to the patient including shortness of breath, tingling/numbness in hands, complete motor block, or any concerning symptoms with instructions to call for help. Patient was given instructions  on fall risk and not to get out of bed. All questions and concerns addressed with instructions to call with any issues or inadequate analgesia.    Reason for block:procedure for pain

## 2017-11-22 NOTE — Progress Notes (Signed)
Subjective: Pt is comfortable with epidural.  Denies headache or blurred vision.  Objective: BP 121/78 (BP Location: Right Arm)   Pulse 63   Temp 97.8 F (36.6 C) (Oral)   Resp 18   Ht 5' (1.524 m)   Wt 103.9 kg   LMP 02/13/2017   SpO2 98%   BMI 44.72 kg/m  I/O last 3 completed shifts: In: 1797.2 [I.V.:1797.2] Out: 375 [Urine:375] No intake/output data recorded.  FHT: Category 1 FHT 120 accels, variability present, occ variable decel UC:   regular, every 2 minutes SVE:   Dilation: 5 Effacement (%): 100 Station: Ballotable Exam by:: Harriett Sine, CNM Pitocin at off MVUs 200  Assessment:  Pt is a G1P0 at 38.6 IUP active labor Cat 2 strip  Plan: Monitor strip and progress   Kenney Houseman CNM, MSN 11/22/2017, 7:58 PM

## 2017-11-22 NOTE — Progress Notes (Addendum)
Tammy Butler is a 32 y.o. G1P0 at [redacted]w[redacted]d admitted for induction of labor due to Hypertension.  Subjective: Cooks catheter came out. Forebag palpated in front of fetal head but fetal station high after the cooks. Will continue to increase pitocin and consider AROM when patient laboring and safe to do so.   Objective: Vitals:   11/22/17 1001 11/22/17 1045 11/22/17 1131 11/22/17 1202  BP: (!) 135/96 (!) 152/85 138/87 134/81  Pulse: 71 63 64 71  Resp: 15 16 15 15   Temp:      TempSrc:      SpO2:      Weight:      Height:       FHT:  FHR: 130 bpm, variability: moderate,  accelerations:  Present,  decelerations:  Absent UC:   irregular, every 2-4 minutes SVE:   Dilation: 4 Effacement (%): 50 Station: Ballotable Exam by:: Waynette Buttery, CNM  Labs: Lab Results  Component Value Date   WBC 6.0 11/22/2017   HGB 10.2 (L) 11/22/2017   HCT 30.9 (L) 11/22/2017   MCV 81.1 11/22/2017   PLT 186 11/22/2017    Assessment / Plan: Induction of labor due to gestational hypertension,  progressing well on pitocin  Labor: Progressing on Pitocin, will continue to increase then AROM Preeclampsia:  no signs or symptoms of toxicity, intake and ouput balanced and labs stable Fetal Wellbeing:  Category I Pain Control:  Labor support without medications I/D:  n/a Anticipated MOD:  NSVD  [name] 11/22/2017, 12:37 PM

## 2017-11-22 NOTE — Anesthesia Preprocedure Evaluation (Signed)
Anesthesia Evaluation  Patient identified by MRN, date of birth, ID band Patient awake    Reviewed: Allergy & Precautions, Patient's Chart, lab work & pertinent test results  Airway Mallampati: II       Dental no notable dental hx.    Pulmonary asthma ,    Pulmonary exam normal        Cardiovascular hypertension, Normal cardiovascular exam     Neuro/Psych Anxiety    GI/Hepatic GERD  Medicated,  Endo/Other    Renal/GU      Musculoskeletal   Abdominal   Peds  Hematology   Anesthesia Other Findings   Reproductive/Obstetrics (+) Pregnancy                             Anesthesia Physical Anesthesia Plan  ASA: III  Anesthesia Plan: Epidural   Post-op Pain Management:    Induction:   PONV Risk Score and Plan:   Airway Management Planned: Natural Airway  Additional Equipment: None  Intra-op Plan:   Post-operative Plan:   Informed Consent: I have reviewed the patients History and Physical, chart, labs and discussed the procedure including the risks, benefits and alternatives for the proposed anesthesia with the patient or authorized representative who has indicated his/her understanding and acceptance.     Plan Discussed with:   Anesthesia Plan Comments: (Lab Results      Component                Value               Date                      WBC                      6.0                 11/22/2017                HGB                      10.2 (L)            11/22/2017                HCT                      30.9 (L)            11/22/2017                MCV                      81.1                11/22/2017                PLT                      186                 11/22/2017           )        Anesthesia Quick Evaluation

## 2017-11-22 NOTE — Anesthesia Pain Management Evaluation Note (Signed)
  CRNA Pain Management Visit Note  Patient: Tammy Butler, 32 y.o., female  "Hello I am a member of the anesthesia team at [facility]. We have an anesthesia team available at all times to provide care throughout the hospital, including epidural management and anesthesia for C-section. I don't know your plan for the delivery whether it a natural birth, water birth, IV sedation, nitrous supplementation, doula or epidural, but we want to meet your pain goals."   1.Was your pain managed to your expectations on prior hospitalizations?   Yes   2.What is your expectation for pain management during this hospitalization?     Epidural  3.How can we help you reach that goal? epidural  Record the patient's initial score and the patient's pain goal.   Pain: 2  Pain Goal: 6 [facility] wants you to be able to say your pain was always managed very well.  Rica Records 11/22/2017

## 2017-11-22 NOTE — Progress Notes (Signed)
Tammy Butler is a 32 y.o. G1P0 at [redacted]w[redacted]d admitted for induction of labor due to Hypertension.  Subjective: Patient comfortable with epidural. Repositioned patient to right lateral with peanut ball.   Objective: Vitals:   11/22/17 1621 11/22/17 1632 11/22/17 1645 11/22/17 1702  BP: (!) 156/92 (!) 148/92 135/84 119/75  Pulse: (!) 56 68 63 77  Resp: 15 15 15 15   Temp:      TempSrc:      SpO2: 99% 99% 98%   Weight:      Height:       FHT:  FHR: 125 bpm, variability: moderate,  accelerations:  Present,  decelerations:  Present earlies with occasional mild variables UC:   Regular, every 2 minutes SVE:   Dilation: 5 Effacement (%): 70 Station: -2 Exam by:: Waynette Buttery, CNM  Labs: Lab Results  Component Value Date   WBC 6.0 11/22/2017   HGB 10.2 (L) 11/22/2017   HCT 30.9 (L) 11/22/2017   MCV 81.1 11/22/2017   PLT 186 11/22/2017    Assessment / Plan: Induction of labor due to gestational hypertension,  progressing well on pitocin  Labor: Progressing normally Preeclampsia:  no signs or symptoms of toxicity, intake and ouput balanced and labs stable Fetal Wellbeing:  Category II but overall reassuring  Pain Control:  Labor support without medications I/D:  n/a Anticipated MOD:  NSVD  [name] 11/22/2017, 5:31 PM

## 2017-11-22 NOTE — Progress Notes (Signed)
Ellasyn A Alvis is a 32 y.o. G1P0 at [redacted]w[redacted]d admitted for induction of labor due to Hypertension.  Subjective: Discussed cooks catheter insertion with patient who consented. Cooks placed with 60ml in uterine balloon. Will start low dose pitocin and stop at 50mu/min until Cooks comes out.   Objective: Vitals:   11/22/17 0702 11/22/17 0713 11/22/17 0809 11/22/17 0925  BP: (!) 134/109 121/74 (!) 154/87 (!) 150/94  Pulse: 82 67 65 68  Resp:  16 15 15   Temp:    97.9 F (36.6 C)  TempSrc:    Oral  SpO2:      Weight:      Height:       FHT:  FHR: 125 bpm, variability: moderate,  accelerations:  Present,  decelerations:  Present early decels UC:   irregular, every 2-4 minutes SVE:   Dilation: 1 Effacement (%): 50 Station: -3 Exam by:: Erin, CNM  Labs: Lab Results  Component Value Date   WBC 6.7 11/21/2017   HGB 10.0 (L) 11/21/2017   HCT 30.8 (L) 11/21/2017   MCV 82.6 11/21/2017   PLT 200 11/21/2017    Assessment / Plan: Induction of labor due to gestational hypertension,  progressing well on pitocin  Labor: Progressing on Pitocin, will continue to increase then AROM Preeclampsia:  no signs or symptoms of toxicity, intake and ouput balanced and labs stable Fetal Wellbeing:  Category I Pain Control:  Labor support without medications I/D:  n/a Anticipated MOD:  NSVD  [name] 11/22/2017, 9:45 AM

## 2017-11-22 NOTE — Progress Notes (Signed)
Subjective:  Starting to feel some cramping.  Objective: BP 137/84   Pulse 81   Temp 98 F (36.7 C) (Oral)   Resp 16   Ht 5' (1.524 m)   Wt 103.9 kg   LMP 02/13/2017   SpO2 100%   BMI 44.72 kg/m  No intake/output data recorded. No intake/output data recorded.  FHT: Category UC:   Nothing on toco, but patient reports cramping SVE:   Dilation: 1 Effacement (%): 50 Station: -3 Exam by:: Haywood Meinders, CNM Pitocin at N/A  MVUs N/A  Assessment:  IOL for GHTN Reassuring FHR BP Stable  Plan:  Repeat dose of PO Cytotec Continue close observation  Altamese Cabal CNM, MSN 11/22/2017, 5:49 AM

## 2017-11-23 ENCOUNTER — Encounter (HOSPITAL_COMMUNITY): Payer: Self-pay

## 2017-11-23 LAB — CBC WITH DIFFERENTIAL/PLATELET
BASOS ABS: 0 10*3/uL (ref 0.0–0.1)
Basophils Relative: 0 %
EOS ABS: 0 10*3/uL (ref 0.0–0.5)
EOS PCT: 0 %
HCT: 33.5 % — ABNORMAL LOW (ref 36.0–46.0)
Hemoglobin: 11 g/dL — ABNORMAL LOW (ref 12.0–15.0)
Lymphocytes Relative: 7 %
Lymphs Abs: 0.8 10*3/uL (ref 0.7–4.0)
MCH: 26.7 pg (ref 26.0–34.0)
MCHC: 32.8 g/dL (ref 30.0–36.0)
MCV: 81.3 fL (ref 80.0–100.0)
MONO ABS: 0.5 10*3/uL (ref 0.1–1.0)
Monocytes Relative: 4 %
NRBC: 0 % (ref 0.0–0.2)
Neutro Abs: 10 10*3/uL — ABNORMAL HIGH (ref 1.7–7.7)
Neutrophils Relative %: 89 %
PLATELETS: 196 10*3/uL (ref 150–400)
RBC: 4.12 MIL/uL (ref 3.87–5.11)
RDW: 16.8 % — AB (ref 11.5–15.5)
WBC: 11.2 10*3/uL — AB (ref 4.0–10.5)

## 2017-11-23 MED ORDER — ONDANSETRON HCL 4 MG/2ML IJ SOLN: 4.0000 mg | INTRAMUSCULAR | Status: DC | PRN

## 2017-11-23 MED ORDER — PRENATAL MULTIVITAMIN CH
1.0000 | ORAL_TABLET | Freq: Every day | ORAL | Status: DC
  Administered 2017-11-23 – 2017-11-24 (×2): 1 via ORAL
  Filled 2017-11-23 (×2): qty 1

## 2017-11-23 MED ORDER — IBUPROFEN 600 MG PO TABS
600.0000 mg | ORAL_TABLET | Freq: Four times a day (QID) | ORAL | Status: DC
  Administered 2017-11-23 – 2017-11-25 (×9): 600 mg via ORAL
  Filled 2017-11-23 (×9): qty 1

## 2017-11-23 MED ORDER — BENZOCAINE-MENTHOL 20-0.5 % EX AERO: 1.0000 "application " | INHALATION_SPRAY | CUTANEOUS | Status: DC | PRN

## 2017-11-23 MED ORDER — ZOLPIDEM TARTRATE 5 MG PO TABS: 5.0000 mg | ORAL_TABLET | Freq: Every evening | ORAL | Status: DC | PRN

## 2017-11-23 MED ORDER — INFLUENZA VAC SPLIT QUAD 0.5 ML IM SUSY: 0.5000 mL | PREFILLED_SYRINGE | INTRAMUSCULAR | Status: DC

## 2017-11-23 MED ORDER — DIPHENHYDRAMINE HCL 25 MG PO CAPS: 25.0000 mg | ORAL_CAPSULE | Freq: Four times a day (QID) | ORAL | Status: DC | PRN

## 2017-11-23 MED ORDER — SIMETHICONE 80 MG PO CHEW: 80.0000 mg | CHEWABLE_TABLET | ORAL | Status: DC | PRN

## 2017-11-23 MED ORDER — ONDANSETRON HCL 4 MG PO TABS: 4.0000 mg | ORAL_TABLET | ORAL | Status: DC | PRN

## 2017-11-23 MED ORDER — ACETAMINOPHEN 325 MG PO TABS: 650.0000 mg | ORAL_TABLET | ORAL | Status: DC | PRN

## 2017-11-23 MED ORDER — DIBUCAINE 1 % RE OINT: 1.0000 "application " | TOPICAL_OINTMENT | RECTAL | Status: DC | PRN

## 2017-11-23 MED ORDER — COCONUT OIL OIL
1.0000 "application " | TOPICAL_OIL | Status: DC | PRN
  Administered 2017-11-23: 1 via TOPICAL
  Filled 2017-11-23: qty 120

## 2017-11-23 MED ORDER — TETANUS-DIPHTH-ACELL PERTUSSIS 5-2.5-18.5 LF-MCG/0.5 IM SUSP: 0.5000 mL | Freq: Once | INTRAMUSCULAR | Status: DC

## 2017-11-23 MED ORDER — SENNOSIDES-DOCUSATE SODIUM 8.6-50 MG PO TABS
2.0000 | ORAL_TABLET | ORAL | Status: DC
  Administered 2017-11-24 – 2017-11-25 (×2): 2 via ORAL
  Filled 2017-11-23 (×2): qty 2

## 2017-11-23 MED ORDER — WITCH HAZEL-GLYCERIN EX PADS: 1.0000 "application " | MEDICATED_PAD | CUTANEOUS | Status: DC | PRN

## 2017-11-23 MED ORDER — LACTATED RINGERS IV SOLN
INTRAVENOUS | Status: DC
  Administered 2017-11-23 (×2): via INTRAVENOUS

## 2017-11-23 NOTE — Progress Notes (Signed)
Subjective: Pt sleeping.  Objective: BP (!) 134/91   Pulse 81   Temp 98 F (36.7 C) (Oral)   Resp 18   Ht 5' (1.524 m)   Wt 103.9 kg   LMP 02/13/2017   SpO2 98%   BMI 44.72 kg/m  I/O last 3 completed shifts: In: 1797.2 [I.V.:1797.2] Out: 375 [Urine:375] Total I/O In: 423.5 [P.O.:20; I.V.:403.5] Out: 575 [Urine:575]  FHT: Category 1 UC:   regular, every 2-3 minutes SVE:   10/100/0 Pitocin at 5 mu MVUs 185  Assessment:  Pt is a G1P0 at 38.6 IUP active labor Cat 1 strip reassuring   Plan: Labor down Anticipate SVD  Kenney Houseman CNM, MSN 11/23/2017, 1:15 AM

## 2017-11-23 NOTE — Lactation Note (Addendum)
This note was copied from a baby's chart. Lactation Consultation Note  Patient Name: Tammy Butler ZOXWR'U Date: 11/23/2017   Mom on mag [redacted] week gestation SGA infant, baby Tammy "Tammy Butler" .  Mom reprots she plans to breastfeed if she can.  Mom reports if she is not able to breastfeed she plans pumped milk and if she can't pump. Plans formula.  Mom has already pumped with DEBP and gotten close to 4 ml out. Colostrum easily expressed.  Demo hand expression with mom and mom able to demo hand expression back.  Urged mom to breastfeed first and then hand express and spoon feed back all EBM past breastfeeds via spoon or whatever moms comfortable with.  Gave infant just a few drops from spoon.  It roused infant and she started cuing.  Infant latched and breastfed well. Mom reports comfort. Urged mom to watch [employer] hand expression maximizing milk production video. Left infant and mom breastfeeding.  Mom has no breastfeeding education. Urged mom to feed on cue and every 3 hours.  Urged hand expression and spoon feeding small drops of colostrum when infant would not wake to feed.  Urged mom to call for assistance as needed.  Did not give information regarding support resources for [facility] breastfeeding services at his time or info about support group.  Urged mom to ask for them tomorrow when someone from lactation sees her.   Maternal Data    Feeding    LATCH Score                   Interventions    Lactation Tools Discussed/Used     Consult Status      Tammy Butler 11/23/2017, 5:51 PM

## 2017-11-23 NOTE — Plan of Care (Signed)
Pt teaching performed, pt delivered infant. Pt verbalized understanding.  Theone Murdoch, RN

## 2017-11-23 NOTE — Anesthesia Postprocedure Evaluation (Signed)
Anesthesia Post Note  Patient: Tammy Butler  Procedure(s) Performed: AN AD HOC LABOR EPIDURAL     Patient location during evaluation: Mother Baby Anesthesia Type: Epidural Level of consciousness: awake and alert and oriented Pain management: satisfactory to patient Vital Signs Assessment: post-procedure vital signs reviewed and stable Respiratory status: respiratory function stable Cardiovascular status: stable Postop Assessment: no headache, no backache, epidural receding, patient able to bend at knees, no signs of nausea or vomiting and adequate PO intake Anesthetic complications: no    Last Vitals:  Vitals:   11/23/17 0616 11/23/17 0900  BP: 133/76 134/89  Pulse: 73 80  Resp: 20 20  Temp: 36.8 C 36.7 C  SpO2: 99% 99%    Last Pain:  Vitals:   11/23/17 0900  TempSrc: Oral  PainSc:    Pain Goal: Patients Stated Pain Goal: 2 (11/23/17 0515)               Karleen Dolphin

## 2017-11-24 LAB — CBC
HCT: 29.7 % — ABNORMAL LOW (ref 36.0–46.0)
HEMOGLOBIN: 9.8 g/dL — AB (ref 12.0–15.0)
MCH: 27.1 pg (ref 26.0–34.0)
MCHC: 33 g/dL (ref 30.0–36.0)
MCV: 82 fL (ref 80.0–100.0)
PLATELETS: 201 10*3/uL (ref 150–400)
RBC: 3.62 MIL/uL — AB (ref 3.87–5.11)
RDW: 17.5 % — ABNORMAL HIGH (ref 11.5–15.5)
WBC: 10.3 10*3/uL (ref 4.0–10.5)
nRBC: 0 % (ref 0.0–0.2)

## 2017-11-24 MED ORDER — FERROUS SULFATE 325 (65 FE) MG PO TABS
325.0000 mg | ORAL_TABLET | Freq: Two times a day (BID) | ORAL | Status: DC
  Administered 2017-11-24 – 2017-11-25 (×2): 325 mg via ORAL
  Filled 2017-11-24 (×2): qty 1

## 2017-11-24 NOTE — Progress Notes (Signed)
Subjective: Postpartum Day # 1 : S/P NSVD due to induction for GHTN, not medicated . Pregnancy hx of anemia, asthma, increased BMI (BMI: 44). Patient up ad lib, denies syncope or dizziness. Reports consuming regular diet without issues and denies N/V. Patient reports 0 bowel movement + passing flatus.  Denies issues with urination and reports bleeding is "light."  Patient is Breastfeeding and reports going well.  Desires undecided for postpartum contraception.  Pain is being appropriately managed with use of motrin. Asymptomatic anemia. Pt denies HA, vision changes, no RUQ pain.   No laceration Feeding:  Breast Contraceptive plan:  Undecided Baby Female  Objective: Vital signs in last 24 hours: Patient Vitals for the past 24 hrs:  BP Temp Temp src Pulse Resp SpO2  11/24/17 0845 140/86 97.8 F (36.6 C) Oral 65 20 100 %  11/24/17 0452 (!) 144/89 98.1 F (36.7 C) Oral 65 20 100 %  11/24/17 0100 - - - - 18 -  11/24/17 0007 - - - - 18 -  11/23/17 2312 135/81 98.3 F (36.8 C) Oral 75 20 100 %  11/23/17 2200 - - - - 18 -  11/23/17 2100 - - - - 18 -  11/23/17 2033 - - - - 20 -  11/23/17 1943 (!) 133/97 99.2 F (37.3 C) Oral 80 20 100 %  11/23/17 1645 (!) 140/91 98.7 F (37.1 C) Oral 85 20 100 %  11/23/17 1212 (!) 124/91 - - 77 - 99 %     Physical Exam:  General: alert, cooperative, appears stated age and no distress Mood/Affect: Happy Lungs: clear to auscultation, no wheezes, rales or rhonchi, symmetric air entry.  Heart: normal rate, regular rhythm, normal S1, S2, no murmurs, rubs, clicks or gallops. Breast: breasts appear normal, no suspicious masses, no skin or nipple changes or axillary nodes. Abdomen:  + bowel sounds, soft, non-tender GU: perineum Inact, healing well. No signs of external hematomas.  Uterine Fundus: firm Lochia: appropriate Skin: Warm, Dry. Neuro: 2+DTR, No clonus DVT Evaluation: No evidence of DVT seen on physical exam. Negative Homan's sign. No cords or  calf tenderness. No significant calf/ankle edema.  CBC Latest Ref Rng & Units 11/24/2017 11/23/2017 11/22/2017  WBC 4.0 - 10.5 K/uL 10.3 11.2(H) 6.0  Hemoglobin 12.0 - 15.0 g/dL 5.6(O) 11.0(L) 10.2(L)  Hematocrit 36.0 - 46.0 % 29.7(L) 33.5(L) 30.9(L)  Platelets 150 - 400 K/uL 201 196 186    Results for orders placed or performed during the hospital encounter of 11/21/17 (from the past 24 hour(s))  CBC     Status: Abnormal   Collection Time: 11/24/17  5:39 AM  Result Value Ref Range   WBC 10.3 4.0 - 10.5 K/uL   RBC 3.62 (L) 3.87 - 5.11 MIL/uL   Hemoglobin 9.8 (L) 12.0 - 15.0 g/dL   HCT 13.0 (L) 86.5 - 78.4 %   MCV 82.0 80.0 - 100.0 fL   MCH 27.1 26.0 - 34.0 pg   MCHC 33.0 30.0 - 36.0 g/dL   RDW 69.6 (H) 29.5 - 28.4 %   Platelets 201 150 - 400 K/uL   nRBC 0.0 0.0 - 0.2 %     CBG (last 3)  No results for input(s): GLUCAP in the last 72 hours.   I/O last 3 completed shifts: In: 3130.4 [P.O.:1930; I.V.:1200.4] Out: 5225 [Urine:5075; Blood:150]   Assessment Postpartum Day # 3 : S/P NSVD due to [facility] controled with no meds. Pt stable. -2 involution. Breastfeeding. Hemodynamically stable. HGB post delivery 9.8, BP  stable at 140/86. Asymptomatic anemia.   Plan: Continue other mgmt as ordered VTE prophylactics: Early ambulated as tolerates.  Pain control: Motrin/Tylenol PRN GHTN: Monitor BP if Bp >150/100 will start procardia 30XLmg daily. Anemia: Iron started today.  Education given regarding options for contraception, including barrier methods, injectable contraception, IUD placement, oral contraceptives.  Plan for discharge tomorrow, Breastfeeding, Lactation consult and Contraception undecided.    Dr. Mora Appl to be updated on patient status  Ronasia Isola NP-C, CNM 11/24/2017, 10:48 AM

## 2017-11-24 NOTE — Progress Notes (Signed)
MOB was referred for history of depression/anxiety. * Referral screened out by Clinical Social Worker because none of the following criteria appear to apply: ~ History of anxiety/depression during this pregnancy, or of post-partum depression following prior delivery. ~ Diagnosis of anxiety and/or depression within last 3 years OR * MOB's symptoms currently being treated with medication and/or therapy. Please contact the Clinical Social Worker if needs arise, by MOB request, or if MOB scores greater than 9/yes to question 10 on Edinburgh Postpartum Depression Screen.  Tammy Butler, MSW, LCSW Clinical Social Work [phone]  

## 2017-11-25 MED ORDER — NIFEDIPINE ER OSMOTIC RELEASE 30 MG PO TB24
30.0000 mg | ORAL_TABLET | Freq: Every day | ORAL | Status: DC
  Administered 2017-11-25: 30 mg via ORAL
  Filled 2017-11-25: qty 1

## 2017-11-25 MED ORDER — IBUPROFEN 600 MG PO TABS: 600.0000 mg | ORAL_TABLET | Freq: Four times a day (QID) | ORAL | 0 refills | Status: AC

## 2017-11-25 MED ORDER — NIFEDIPINE ER 30 MG PO TB24: 30.0000 mg | ORAL_TABLET | Freq: Every day | ORAL | 1 refills | Status: DC

## 2017-11-25 NOTE — Progress Notes (Signed)
Patient discharged home with infant. Discharge teaching, home care, prescriptions, s/s of PIH, and follow-up discussed. Pt verbalized understanding.

## 2017-11-25 NOTE — Lactation Note (Signed)
This note was copied from a baby's chart. Lactation Consultation Note Baby 44 hrs old at the time of consult. Mom c/o sore nipples, painful latches. Mom attempting to latch in cradle position to flat nipples w/large soft breast. Breast over baby's nose. Mom not holding breast to guide for latching, but pushing and pulling breast tissue to try to latch. Baby on tip of nipple. Nipples intact, no breast down noted. Mom grimacing in pain w/latches.  Encouraged football hold. Discussed positioning, breast support, comfort during feeding.  Breast very compressible. Obtained a deep latch after several attempts. Baby tongue thrusting, suckling on tip of nipple. Needed sand witch hold to feed nipple and areola into mom's mouth. Nipple everts well w/stimulation. Hand expressed colostrum into baby's mouth. Baby kept pulling off to tip. Mom kept pulling back on breast after multiple times suggested not to d/t undoing latch.  Assessed baby's suck. Humping tongue in back of mouth not extending under finger. Chewing at times. Noted thick labial frenulum, probable tight frenulum d/t limited mobility of tongue.   Fitted mom w/#20 Ns. Baby latched well. Mom stated felt better still uncomfortable. Flanged lips more. Mom cont. To pull back on breast. Mom can see baby well. Discussed body alignment, unlatching bad latch, maintaining good latch.  D/t baby wt. 5.6 and 5% wt. Loss, LC discussed supplementing. Mom in agreement. Mom pumped 1 ml colostrum. Encouraged to give colostrum 1st. Gave mom feeding amount according to hours of age for babies under 6 lbs. LC bringing milk and 5 fr tubing, was going to suggest trying supplement using 5 fr, FOB came to desk before that stating mom doesn't want to BF any long only formula feed. Stated she was hurting, nipples sore and wasn't for her. Discussed mom could always pump and bottle feed if desires or changes her mind call for assistance. Similac 22 cal. Given. RN notified to  obtain order. Patient Name: Tammy Butler ZOXWR'U Date: 11/25/2017 Reason for consult: Follow-up assessment;Difficult latch;1st time breastfeeding;Infant < 6lbs;Nipple pain/trauma   Maternal Data Has patient been taught Hand Expression?: Yes Does the patient have breastfeeding experience prior to this delivery?: No  Feeding Feeding Type: Breast Fed  LATCH Score Latch: Repeated attempts needed to sustain latch, nipple held in mouth throughout feeding, stimulation needed to elicit sucking reflex.  Audible Swallowing: None  Type of Nipple: Flat  Comfort (Breast/Nipple): Filling, red/small blisters or bruises, mild/mod discomfort  Hold (Positioning): Assistance needed to correctly position infant at breast and maintain latch.  LATCH Score: 4  Interventions Interventions: Breast feeding basics reviewed;Support pillows;Assisted with latch;Position options;Skin to skin;Breast massage;Coconut oil;Hand express;Shells;Pre-pump if needed;Hand pump;Breast compression;DEBP;Adjust position  Lactation Tools Discussed/Used Tools: Shells;Pump;Coconut oil;Nipple Shields Nipple shield size: 20 Shell Type: Inverted Breast pump type: Double-Electric Breast Pump;Manual WIC Program: No Pump Review: Setup, frequency, and cleaning;Milk Storage Initiated by:: RN/LC Date initiated:: 11/24/17   Consult Status Consult Status: PRN Date: 11/25/17 Follow-up type: In-patient    Charyl Dancer 11/25/2017, 12:32 AM

## 2017-11-25 NOTE — Discharge Summary (Signed)
OB Discharge Summary     Patient Name: Tammy Butler DOB: 1985/08/14 MRN: [id number]  Date of admission: 11/21/2017 Delivering MD: Kenney Houseman   Date of discharge: 11/25/2017  Admitting diagnosis: ELAVATED BP Intrauterine pregnancy: [redacted]w[redacted]d     Secondary diagnosis:  Active Problems:   Gestational hypertension   SVD (spontaneous vaginal delivery)  Additional problems: None     Discharge diagnosis: Term Pregnancy Delivered, Gestational Hypertension and Anemia                                                                                                Post partum procedures:Magnesium sulfate  Augmentation: AROM, Pitocin and Foley Balloon  Complications: None  Hospital course:  Induction of Labor With Vaginal Delivery   32 y.o. yo G1P1001 at [redacted]w[redacted]d was admitted to the hospital 11/21/2017 for induction of labor.  Indication for induction: Gestational hypertension.  Patient had an uncomplicated labor course as follows: Membrane Rupture Time/Date: 3:27 PM ,11/22/2017   Intrapartum Procedures: Episiotomy: None [1]                                         Lacerations:  None [1]  Patient had delivery of a Viable infant.  Information for the patient's newborn:  Vihana, Kydd [[id number]]  Delivery Method: Vag-Spont   11/23/2017  Details of delivery can be found in separate delivery note.  Patient had a routine postpartum course. Patient is discharged home 11/25/17.  Physical exam  Vitals:   11/24/17 1958 11/24/17 2315 11/25/17 0616 11/25/17 0754  BP: 140/90 (!) 146/100 (!) 144/96 (!) 148/78  Pulse: 68 71 (!) 50 (!) 57  Resp: 18 18 17 18   Temp: 99.5 F (37.5 C) 97.9 F (36.6 C) 97.8 F (36.6 C) 99.1 F (37.3 C)  TempSrc: Oral Oral Oral Oral  SpO2: 99% 100% 100% 100%  Weight:      Height:       General: alert, cooperative and no distress Lochia: appropriate Uterine Fundus: firm Incision: N/A DVT Evaluation: No evidence of DVT seen on physical  exam. Labs: Lab Results  Component Value Date   WBC 10.3 11/24/2017   HGB 9.8 (L) 11/24/2017   HCT 29.7 (L) 11/24/2017   MCV 82.0 11/24/2017   PLT 201 11/24/2017   CMP Latest Ref Rng & Units 11/21/2017  Glucose 70 - 99 mg/dL 91  BUN 6 - 20 mg/dL 9  Creatinine 7.82 - 9.56 mg/dL 2.13  Sodium 086 - 578 mmol/L 135  Potassium 3.5 - 5.1 mmol/L 4.0  Chloride 98 - 111 mmol/L 105  CO2 22 - 32 mmol/L 22  Calcium 8.9 - 10.3 mg/dL 4.6(N)  Total Protein 6.5 - 8.1 g/dL 6.2(X)  Total Bilirubin 0.3 - 1.2 mg/dL 0.6  Alkaline Phos 38 - 126 U/L 86  AST 15 - 41 U/L 19  ALT 0 - 44 U/L 18    Discharge instruction: per After Visit Summary and "Baby and Me Booklet".  After visit meds:  Allergies as  of 11/25/2017      Reactions   Peanut-containing Drug Products Itching   Everything except peanuts    Other    Nuts unspecified      Medication List    STOP taking these medications   calcium carbonate 500 MG chewable tablet Commonly known as:  TUMS - dosed in mg elemental calcium   hydrocortisone butyrate 0.1 % Crea cream Commonly known as:  LUCOID   ranitidine 150 MG tablet Commonly known as:  ZANTAC     TAKE these medications   albuterol 108 (90 Base) MCG/ACT inhaler Commonly known as:  PROVENTIL HFA;VENTOLIN HFA Inhale 2 puffs into the lungs every 4 (four) hours as needed for wheezing or shortness of breath (cough, shortness of breath or wheezing.).   ferrous sulfate 325 (65 FE) MG tablet Take 325 mg by mouth daily.   ibuprofen 600 MG tablet Commonly known as:  ADVIL,MOTRIN Take 1 tablet (600 mg total) by mouth every 6 (six) hours.   NIFEdipine 30 MG 24 hr tablet Commonly known as:  ADALAT CC Take 1 tablet (30 mg total) by mouth daily. Start taking on:  11/26/2017   prenatal multivitamin Tabs tablet Take 1 tablet by mouth daily at 12 noon.       Diet: routine diet  Activity: Advance as tolerated. Pelvic rest for 6 weeks.   Outpatient follow up:6 weeks Follow up  Appt:No future appointments. Follow up Visit:No follow-ups on file.  Postpartum contraception: Undecided  Newborn Data: Live born female  Birth Weight: 5 lb 11 oz (2580 g) APGAR: 8, 8  Newborn Delivery   Birth date/time:  11/23/2017 03:11:00 Delivery type:  Vaginal, Spontaneous     Baby Feeding: Bottle Disposition:home with mother   11/25/2017 Kenney Houseman, CNM

## 2017-11-25 NOTE — Lactation Note (Signed)
This note was copied from a baby's chart. Lactation Consultation Note: Mom states she has decided to give formula only because breast feeding was very painful and she was not making enough milk. Reviewed milk supply increasing and engorgement prevention and treatment. Has manual pump for home. Discussed pumping and bottle feeding - mom states she may do that when her milk supply increases. No further questions at present. Reviewed that we will assist if she desires. To call prn  Patient Name: Tammy Butler ZOXWR'U Date: 11/25/2017 Reason for consult: Follow-up assessment   Maternal Data Has patient been taught Hand Expression?: Yes Does the patient have breastfeeding experience prior to this delivery?: No  Feeding Feeding Type: Bottle Fed - Formula  LATCH Score                   Interventions    Lactation Tools Discussed/Used WIC Program: No   Consult Status Consult Status: Complete    Pamelia Hoit 11/25/2017, 9:21 AM

## 2017-11-25 NOTE — Progress Notes (Signed)
S: Noted elevated BP over night.  O: Patient Vitals for the past 24 hrs:  BP Temp Temp src Pulse Resp SpO2  11/25/17 0616 (!) 144/96 97.8 F (36.6 C) Oral (!) 50 17 100 %  11/24/17 2315 (!) 146/100 97.9 F (36.6 C) Oral 71 18 100 %  11/24/17 1958 140/90 99.5 F (37.5 C) Oral 68 18 99 %  11/24/17 1635 (!) 150/94 98.7 F (37.1 C) Oral 77 20 100 %  11/24/17 1153 126/77 98.3 F (36.8 C) Oral 76 20 100 %  11/24/17 0845 140/86 97.8 F (36.6 C) Oral 65 20 100 %  A/P: GHTN with elevated BP PP: Started procardia 30XLQD  this morning. Plan tot report off to Dr Estanislado Pandy and Jodi Mourning, CNM at 0700 with updates.

## 2017-11-30 ENCOUNTER — Inpatient Hospital Stay (HOSPITAL_COMMUNITY): Admission: RE | Admit: 2017-11-30 | Payer: BLUE CROSS/BLUE SHIELD | Source: Ambulatory Visit

## 2018-01-21 ENCOUNTER — Ambulatory Visit (INDEPENDENT_AMBULATORY_CARE_PROVIDER_SITE_OTHER): Payer: BLUE CROSS/BLUE SHIELD | Admitting: Family Medicine

## 2018-01-21 ENCOUNTER — Encounter: Payer: Self-pay | Admitting: Family Medicine

## 2018-01-21 VITALS — BP 132/84 | HR 80 | Temp 98.8°F | Ht 61.0 in | Wt 214.6 lb

## 2018-01-21 DIAGNOSIS — R030 Elevated blood-pressure reading, without diagnosis of hypertension: Secondary | ICD-10-CM

## 2018-01-21 DIAGNOSIS — K219 Gastro-esophageal reflux disease without esophagitis: Secondary | ICD-10-CM | POA: Insufficient documentation

## 2018-01-21 DIAGNOSIS — L309 Dermatitis, unspecified: Secondary | ICD-10-CM | POA: Diagnosis not present

## 2018-01-21 DIAGNOSIS — J4520 Mild intermittent asthma, uncomplicated: Secondary | ICD-10-CM

## 2018-01-21 MED ORDER — LABETALOL HCL 200 MG PO TABS: ORAL_TABLET | ORAL | 1 refills | Status: DC

## 2018-01-21 NOTE — Assessment & Plan Note (Addendum)
Stable.  Continue over-the-counter creams as needed.

## 2018-01-21 NOTE — Assessment & Plan Note (Signed)
Mild intermittent.  Stable.  Continue albuterol as needed.

## 2018-01-21 NOTE — Assessment & Plan Note (Signed)
Blood pressure at goal today on labetalol 200 mg twice daily.  Given that she is now about 9 weeks postpartum, it is likely that she will need long-term use of antihypertensives at some point in the future.  Hopefully she will be able to work on her diet and exercise enough to where we can stop her labetalol soon.  Discussed importance of low-sodium diet and regular exercise.  Discussed importance of weight loss.  Discussed short-term blood pressure monitoring with goal 140/90 or lower.  We will continue labetalol 200 mg twice daily.  She will follow-up with me in 3 to 6 months.

## 2018-01-21 NOTE — Assessment & Plan Note (Signed)
Stable.  Continue ranitidine 150 mg twice daily as needed.

## 2018-01-21 NOTE — Patient Instructions (Signed)
It was very nice to see you today!  No changes today.  Keep an eye on your blood pressure and let me know if persistently 140/90 or higher or if you have any readings less than 100/60.  Come back in 3-6 months to recheck blood pressure. We can also check blood work at that time.  Take care, Dr Jimmey RalphParker   [facility] DASH stands for "Dietary Approaches to Stop Hypertension." The DASH eating plan is a healthy eating plan that has been shown to reduce high blood pressure (hypertension). It may also reduce your risk for type 2 diabetes, heart disease, and stroke. The DASH eating plan may also help with weight loss. What are tips for following this plan? General guidelines  Avoid eating more than 2,300 mg (milligrams) of salt (sodium) a day. If you have hypertension, you may need to reduce your sodium intake to 1,500 mg a day.  Limit alcohol intake to no more than 1 drink a day for nonpregnant women and 2 drinks a day for men. One drink equals 12 oz of beer, 5 oz of wine, or 1 oz of hard liquor.  Work with your health care provider to maintain a healthy body weight or to lose weight. Ask what an ideal weight is for you.  Get at least 30 minutes of exercise that causes your heart to beat faster (aerobic exercise) most days of the week. Activities may include walking, swimming, or biking.  Work with your health care provider or diet and nutrition specialist (dietitian) to adjust your eating plan to your individual calorie needs. Reading food labels  Check food labels for the amount of sodium per serving. Choose foods with less than 5 percent of the Daily Value of sodium. Generally, foods with less than 300 mg of sodium per serving fit into this eating plan.  To find whole grains, look for the word "whole" as the first word in the ingredient list. Shopping  Buy products labeled as "low-sodium" or "no salt added."  Buy fresh foods. Avoid canned foods and premade or frozen  meals. Cooking  Avoid adding salt when cooking. Use salt-free seasonings or herbs instead of table salt or sea salt. Check with your health care provider or pharmacist before using salt substitutes.  Do not fry foods. Cook foods using healthy methods such as baking, boiling, grilling, and broiling instead.  Cook with heart-healthy oils, such as olive, canola, soybean, or sunflower oil. Meal planning   Eat a balanced diet that includes: ? 5 or more servings of fruits and vegetables each day. At each meal, try to fill half of your plate with fruits and vegetables. ? Up to 6-8 servings of whole grains each day. ? Less than 6 oz of lean meat, poultry, or fish each day. A 3-oz serving of meat is about the same size as a deck of cards. One egg equals 1 oz. ? 2 servings of low-fat dairy each day. ? A serving of nuts, seeds, or beans 5 times each week. ? Heart-healthy fats. Healthy fats called Omega-3 fatty acids are found in foods such as flaxseeds and coldwater fish, like sardines, salmon, and mackerel.  Limit how much you eat of the following: ? Canned or prepackaged foods. ? Food that is high in trans fat, such as fried foods. ? Food that is high in saturated fat, such as fatty meat. ? Sweets, desserts, sugary drinks, and other foods with added sugar. ? Full-fat dairy products.  Do not salt foods  before eating.  Try to eat at least 2 vegetarian meals each week.  Eat more home-cooked food and less restaurant, buffet, and fast food.  When eating at a restaurant, ask that your food be prepared with less salt or no salt, if possible. What foods are recommended? The items listed may not be a complete list. Talk with your dietitian about what dietary choices are best for you. Grains Whole-grain or whole-wheat bread. Whole-grain or whole-wheat pasta. Brown rice. [place]. Bulgur. Whole-grain and low-sodium cereals. Pita bread. Low-fat, low-sodium crackers. Whole-wheat flour  tortillas. Vegetables Fresh or frozen vegetables (raw, steamed, roasted, or grilled). Low-sodium or reduced-sodium tomato and vegetable juice. Low-sodium or reduced-sodium tomato sauce and tomato paste. Low-sodium or reduced-sodium canned vegetables. Fruits All fresh, dried, or frozen fruit. Canned fruit in natural juice (without added sugar). Meat and other protein foods Skinless chicken or [place]. Ground chicken or [place]. Pork with fat trimmed off. Fish and seafood. Egg whites. Dried beans, peas, or lentils. Unsalted nuts, nut butters, and seeds. Unsalted canned beans. Lean cuts of beef with fat trimmed off. Low-sodium, lean deli meat. Dairy Low-fat (1%) or fat-free (skim) milk. Fat-free, low-fat, or reduced-fat cheeses. Nonfat, low-sodium ricotta or cottage cheese. Low-fat or nonfat yogurt. Low-fat, low-sodium cheese. Fats and oils Soft margarine without trans fats. Vegetable oil. Low-fat, reduced-fat, or light mayonnaise and salad dressings (reduced-sodium). Canola, safflower, olive, soybean, and sunflower oils. Avocado. Seasoning and other foods Herbs. Spices. Seasoning mixes without salt. Unsalted popcorn and pretzels. Fat-free sweets. What foods are not recommended? The items listed may not be a complete list. Talk with your dietitian about what dietary choices are best for you. Grains Baked goods made with fat, such as croissants, muffins, or some breads. Dry pasta or rice meal packs. Vegetables Creamed or fried vegetables. Vegetables in a cheese sauce. Regular canned vegetables (not low-sodium or reduced-sodium). Regular canned tomato sauce and paste (not low-sodium or reduced-sodium). Regular tomato and vegetable juice (not low-sodium or reduced-sodium). Angie Fava. Olives. Fruits Canned fruit in a light or heavy syrup. Fried fruit. Fruit in cream or butter sauce. Meat and other protein foods Fatty cuts of meat. Ribs. Fried meat. Berniece Salines. Sausage. Bologna and other processed lunch meats.  Salami. Fatback. Hotdogs. Bratwurst. Salted nuts and seeds. Canned beans with added salt. Canned or smoked fish. Whole eggs or egg yolks. Chicken or [place] with skin. Dairy Whole or 2% milk, cream, and half-and-half. Whole or full-fat cream cheese. Whole-fat or sweetened yogurt. Full-fat cheese. Nondairy creamers. Whipped toppings. Processed cheese and cheese spreads. Fats and oils Butter. Stick margarine. Lard. Shortening. Ghee. Bacon fat. Tropical oils, such as coconut, palm kernel, or palm oil. Seasoning and other foods Salted popcorn and pretzels. Onion salt, garlic salt, seasoned salt, table salt, and sea salt. Worcestershire sauce. Tartar sauce. Barbecue sauce. Teriyaki sauce. Soy sauce, including reduced-sodium. Steak sauce. Canned and packaged gravies. Fish sauce. Oyster sauce. Cocktail sauce. Horseradish that you find on the shelf. Ketchup. Mustard. Meat flavorings and tenderizers. Bouillon cubes. Hot sauce and Tabasco sauce. Premade or packaged marinades. Premade or packaged taco seasonings. Relishes. Regular salad dressings. Where to find more information:  National Heart, Lung, and [place]: https://wilson-eaton.com/  American Heart Association: www.heart.org Summary  The DASH eating plan is a healthy eating plan that has been shown to reduce high blood pressure (hypertension). It may also reduce your risk for type 2 diabetes, heart disease, and stroke.  With the DASH eating plan, you should limit salt (sodium) intake to 2,300 mg  a day. If you have hypertension, you may need to reduce your sodium intake to 1,500 mg a day.  When on the DASH eating plan, aim to eat more fresh fruits and vegetables, whole grains, lean proteins, low-fat dairy, and heart-healthy fats.  Work with your health care provider or diet and nutrition specialist (dietitian) to adjust your eating plan to your individual calorie needs. This information is not intended to replace advice given to you by your health  care provider. Make sure you discuss any questions you have with your health care provider. Document Released: 01/17/2011 Document Revised: 01/22/2016 Document Reviewed: 01/22/2016 Elsevier Interactive Patient Education  [employer].

## 2018-01-21 NOTE — Progress Notes (Signed)
Subjective:  Tammy Butler is a 32 y.o. female who presents today with a chief complaint of hypertension and to establish care.   HPI:  Elevated Blood Pressure Patient was diagnosed with gestational hypertension several months ago late in her third trimester.  She was originally started on nifedipine which she did not tolerate due to headaches.  At her 6-week postpartum check a few days ago she was found to be hypertensive and was started on labetalol 200 mg twice daily.  She started this about a week ago as well.  She has not had any symptoms.  No reported chest pain or shortness of breath.  No obvious precipitating events.  Medications seem to help with her blood pressures.  No obvious aggravating factors.  Admits to several dietary indiscretions with salty foods.  Does not regularly exercise.  Her stable, chronic medical conditions are outlined below: 1.  Eczema.  Several year history.  Stable.  Uses over-the-counter cream as needed. 2.  Asthma.  Also several year history.  Uses albuterol inhaler very infrequently.  No current symptoms. 3.  GERD.  Takes ranitidine 150 mg as needed.  Symptoms are well controlled.  ROS: Per HPI, otherwise a complete review of systems was negative.   PMH:  The following were reviewed and entered/updated in epic: Past Medical History:  Diagnosis Date  . Allergy   . Anemia   . Anxiety   . Asthma   . Eczema   . GERD (gastroesophageal reflux disease)   . Pregnancy induced hypertension   . Seasonal allergies    Patient Active Problem List   Diagnosis Date Noted  . Gastroesophageal reflux disease without esophagitis 01/21/2018  . Elevated blood pressure reading 01/21/2018  . Gestational hypertension 11/21/2017  . Asthma, chronic 05/04/2014  . Eczema 05/04/2014   Past Surgical History:  Procedure Laterality Date  . NO PAST SURGERIES      Family History  Problem Relation Age of Onset  . Hypertension Mother   . Stroke Mother   . Heart  disease Mother   . Cancer Maternal Grandfather   . Arthritis Paternal Grandmother   . Diabetes Paternal Grandmother   . Cancer Paternal Grandfather     Medications- reviewed and updated Current Outpatient Medications  Medication Sig Dispense Refill  . albuterol (PROVENTIL HFA;VENTOLIN HFA) 108 (90 Base) MCG/ACT inhaler Inhale 2 puffs into the lungs every 4 (four) hours as needed for wheezing or shortness of breath (cough, shortness of breath or wheezing.). 1 Inhaler 1  . ibuprofen (ADVIL,MOTRIN) 600 MG tablet Take 1 tablet (600 mg total) by mouth every 6 (six) hours. 30 tablet 0  . labetalol (NORMODYNE) 200 MG tablet labetalol 200 mg tablet  Take 1 tablet twice a day by oral route. 180 tablet 1  . ranitidine (ZANTAC) 150 MG tablet Take 150 mg by mouth 2 (two) times daily.     No current facility-administered medications for this visit.     Allergies-reviewed and updated Allergies  Allergen Reactions  . Other     Everything except peanuts and cashews    Social History   Socioeconomic History  . Marital status: Significant Other    Spouse name: Not on file  . Number of children: Not on file  . Years of education: Not on file  . Highest education level: Not on file  Occupational History  . Not on file  Social Needs  . Financial resource strain: Not hard at all  . Food insecurity:  Worry: Patient refused    Inability: Patient refused  . Transportation needs:    Medical: Patient refused    Non-medical: Patient refused  Tobacco Use  . Smoking status: Passive Smoke Exposure - Never Smoker  . Smokeless tobacco: Never Used  Substance and Sexual Activity  . Alcohol use: Not Currently    Comment: occasional  . Drug use: No  . Sexual activity: Yes    Birth control/protection: None  Lifestyle  . Physical activity:    Days per week: Patient refused    Minutes per session: Patient refused  . Stress: Not at all  Relationships  . Social connections:    Talks on phone:  Patient refused    Gets together: Patient refused    Attends religious service: Patient refused    Active member of club or organization: Patient refused    Attends meetings of clubs or organizations: Patient refused    Relationship status: Patient refused  Other Topics Concern  . Not on file  Social History Narrative  . Not on file     Objective:  Physical Exam: BP 132/84 (BP Location: Right Arm, Patient Position: Sitting, Cuff Size: Normal)   Pulse 80   Temp 98.8 F (37.1 C) (Oral)   Ht 5\' 1"  (1.549 m)   Wt 214 lb 9.6 oz (97.3 kg)   LMP 02/13/2017   SpO2 97%   BMI 40.55 kg/m   Gen: NAD, resting comfortably CV: RRR with no murmurs appreciated Pulm: NWOB, CTAB with no crackles, wheezes, or rhonchi GI: Normal bowel sounds present. Soft, Nontender, Nondistended. MSK: No edema, cyanosis, or clubbing noted Skin: Warm, dry Neuro: Grossly normal, moves all extremities Psych: Normal affect and thought content  Assessment/Plan:  Asthma, chronic Mild intermittent.  Stable.  Continue albuterol as needed.  Gastroesophageal reflux disease without esophagitis Stable.  Continue ranitidine 150 mg twice daily as needed.  Elevated blood pressure reading Blood pressure at goal today on labetalol 200 mg twice daily.  Given that she is now about 9 weeks postpartum, it is likely that she will need long-term use of antihypertensives at some point in the future.  Hopefully she will be able to work on her diet and exercise enough to where we can stop her labetalol soon.  Discussed importance of low-sodium diet and regular exercise.  Discussed importance of weight loss.  Discussed short-term blood pressure monitoring with goal 140/90 or lower.  We will continue labetalol 200 mg twice daily.  She will follow-up with me in 3 to 6 months.    Eczema Stable.  Continue over-the-counter creams as needed.   Katina Degree. Jimmey Ralph, MD 01/21/2018 12:31 PM

## 2019-06-22 ENCOUNTER — Encounter: Payer: Self-pay | Admitting: Family Medicine

## 2019-06-22 ENCOUNTER — Telehealth (INDEPENDENT_AMBULATORY_CARE_PROVIDER_SITE_OTHER): Payer: BC Managed Care – PPO | Admitting: Family Medicine

## 2019-06-22 DIAGNOSIS — R062 Wheezing: Secondary | ICD-10-CM

## 2019-06-22 DIAGNOSIS — J4520 Mild intermittent asthma, uncomplicated: Secondary | ICD-10-CM

## 2019-06-22 MED ORDER — AZITHROMYCIN 250 MG PO TABS: ORAL_TABLET | ORAL | 0 refills | Status: DC

## 2019-06-22 MED ORDER — GUAIFENESIN-CODEINE 100-10 MG/5ML PO SOLN: 5.0000 mL | Freq: Three times a day (TID) | ORAL | 0 refills | Status: DC | PRN

## 2019-06-22 MED ORDER — ALBUTEROL SULFATE HFA 108 (90 BASE) MCG/ACT IN AERS: 2.0000 | INHALATION_SPRAY | RESPIRATORY_TRACT | 2 refills | Status: DC | PRN

## 2019-06-22 NOTE — Assessment & Plan Note (Signed)
With flare.  Will refill albuterol.  We will also send in guaifenesin-codeine cough syrup.  Concern for bacterial superinfection given recent chills after period of improvement.  We will also start azithromycin.  Discussed reasons to return to care.  Follow-up as needed.

## 2019-06-22 NOTE — Progress Notes (Signed)
   Maryuri A Dingwall is a 34 y.o. female who presents today for a virtual office visit.  Assessment/Plan:  Chronic Problems Addressed Today: Asthma, chronic With flare.  Will refill albuterol.  We will also send in guaifenesin-codeine cough syrup.  Concern for bacterial superinfection given recent chills after period of improvement.  We will also start azithromycin.  Discussed reasons to return to care.  Follow-up as needed.     Subjective:  HPI:  Patient has had flareup of asthma for the past several weeks.  Worsened within the last 2 days.  Has some chills yesterday.  Has tried Mucinex with modest improvement.  No known sick contacts.  Or cough.  No sputum production.  No shortness of breath.  No reported chest pain.       Objective/Observations  Physical Exam: Gen: NAD, resting comfortably Pulm: Normal work of breathing Neuro: Grossly normal, moves all extremities Psych: Normal affect and thought content  Virtual Visit via Video   I connected with Melanie A Weisberg on 06/22/19 at 11:40 AM EDT by a video enabled telemedicine application and verified that I am speaking with the correct person using two identifiers. The limitations of evaluation and management by telemedicine and the availability of in person appointments were discussed. The patient expressed understanding and agreed to proceed.   Patient location: Home Provider location: [place] [facility] [employer] Persons participating in the virtual visit: Myself and Patient     Katina Degree. Jimmey Ralph, MD 06/22/2019 12:25 PM

## 2020-06-27 ENCOUNTER — Ambulatory Visit: Payer: BC Managed Care – PPO | Admitting: Family Medicine

## 2020-07-05 ENCOUNTER — Ambulatory Visit (INDEPENDENT_AMBULATORY_CARE_PROVIDER_SITE_OTHER): Payer: BC Managed Care – PPO | Admitting: Family Medicine

## 2020-07-05 ENCOUNTER — Encounter: Payer: Self-pay | Admitting: Family Medicine

## 2020-07-05 ENCOUNTER — Other Ambulatory Visit: Payer: Self-pay

## 2020-07-05 VITALS — BP 136/91 | HR 74 | Temp 98.1°F | Ht 61.0 in | Wt 215.6 lb

## 2020-07-05 DIAGNOSIS — J4520 Mild intermittent asthma, uncomplicated: Secondary | ICD-10-CM

## 2020-07-05 DIAGNOSIS — J302 Other seasonal allergic rhinitis: Secondary | ICD-10-CM

## 2020-07-05 DIAGNOSIS — R030 Elevated blood-pressure reading, without diagnosis of hypertension: Secondary | ICD-10-CM

## 2020-07-05 DIAGNOSIS — R062 Wheezing: Secondary | ICD-10-CM | POA: Diagnosis not present

## 2020-07-05 DIAGNOSIS — H43391 Other vitreous opacities, right eye: Secondary | ICD-10-CM | POA: Diagnosis not present

## 2020-07-05 DIAGNOSIS — M79671 Pain in right foot: Secondary | ICD-10-CM

## 2020-07-05 MED ORDER — ALBUTEROL SULFATE HFA 108 (90 BASE) MCG/ACT IN AERS: 2.0000 | INHALATION_SPRAY | RESPIRATORY_TRACT | 2 refills | Status: DC | PRN

## 2020-07-05 NOTE — Progress Notes (Signed)
   Beautifull A Milner is a 35 y.o. female who presents today for an office visit.  Assessment/Plan:  New/Acute Problems: Right foot pain Likely metatarsalgia.  Discussed importance of good footwear.  She will follow-up with podiatry if not proving  Floaters No obvious abnormalities on her funduscopic exam.  She will follow-up with her eye doctor soon.  Chronic Problems Addressed Today: Asthma, chronic We will refill albuterol.  She has had a little bit worsening allergic otitis which is contributing as well.  She has been taking over-the-counter medications which worked reasonably well.  She has been on Singulair in the past but we will hold off on restarting for now.  Seasonal allergies Takes over-the-counter meds which worked reasonably well.  May consider starting Singulair again at some point in the future.  Elevated blood pressure reading Typically well controlled.  Today.  Will not start meds but discussed lifestyle modifications including diet and exercise.  Discussed importance of low-sodium diet.  Preventive healthcare Discussed with patient we can check blood work again in a couple years.  Her glucose tolerance test during pregnancy couple years ago was normal.    Subjective:  HPI:  Patient here for follow-up.  See A/P for status of chronic conditions.  She has had some floaters in her vision.  Seems to be stable.  No changes in vision.  Also has had right midfoot pain and swelling.  Started a few weeks ago.  No injuries.  She is not sure if she was bit by a bug.  She stands on concrete all day.       Objective:  Physical Exam: BP (!) 136/91   Pulse 74   Temp 98.1 F (36.7 C) (Temporal)   Ht 5\' 1"  (1.549 m)   Wt 215 lb 9.6 oz (97.8 kg)   LMP 06/18/2020   SpO2 99%   BMI 40.74 kg/m   Gen: No acute distress, resting comfortably HEENT: Visual acuity grossly intact.  Normal funduscopic exam bilaterally. CV: Regular rate and rhythm with no murmurs  appreciated Pulm: Normal work of breathing, clear to auscultation bilaterally with no crackles, wheezes, or rhonchi MSK: - Right foot: Tender to palpation along second through fourth metatarsal.  No obvious deformities.  Neurovascularly intact distally. Neuro: Grossly normal, moves all extremities Psych: Normal affect and thought content      Amya Hlad M. 08/18/2020, MD 07/05/2020 11:54 AM

## 2020-07-05 NOTE — Assessment & Plan Note (Signed)
Takes over-the-counter meds which worked reasonably well.  May consider starting Singulair again at some point in the future.

## 2020-07-05 NOTE — Patient Instructions (Signed)
It was very nice to see you today!  I will refill your asthma inhaler.  Please try to get some inserts for your feet to see if this helps.  Please follow-up with your eye doctor soon.  Your blood pressure is borderline today.  We do not need to start any medications but please continue to work on diet and exercise.  Your test for diabetes a few years ago was normal.  We should check this again in a few more years.  Take care, Dr Jimmey Ralph  PLEASE NOTE:  If you had any lab tests please let us know if you have not heard back within a few days. You may see your results on mychart before we have a chance to review them but we will give you a call once they are reviewed by [place]. If we ordered any referrals today, please let us know if you have not heard from their office within the next week.   Please try these tips to maintain a healthy lifestyle:   Eat at least 3 REAL meals and 1-2 snacks per day.  Aim for no more than 5 hours between eating.  If you eat breakfast, please do so within one hour of getting up.    Each meal should contain half fruits/vegetables, one quarter protein, and one quarter carbs (no bigger than a computer mouse)   Cut down on sweet beverages. This includes juice, soda, and sweet tea.     Drink at least 1 glass of water with each meal and aim for at least 8 glasses per day   Exercise at least 150 minutes every week.

## 2020-07-05 NOTE — Assessment & Plan Note (Signed)
We will refill albuterol.  She has had a little bit worsening allergic otitis which is contributing as well.  She has been taking over-the-counter medications which worked reasonably well.  She has been on Singulair in the past but we will hold off on restarting for now.

## 2020-07-05 NOTE — Assessment & Plan Note (Signed)
Typically well controlled.  Today.  Will not start meds but discussed lifestyle modifications including diet and exercise.  Discussed importance of low-sodium diet.

## 2021-11-05 ENCOUNTER — Encounter: Payer: Self-pay | Admitting: *Deleted

## 2022-01-24 ENCOUNTER — Encounter: Payer: Self-pay | Admitting: *Deleted

## 2022-09-05 ENCOUNTER — Encounter: Payer: Self-pay | Admitting: Podiatry

## 2022-09-05 ENCOUNTER — Ambulatory Visit (INDEPENDENT_AMBULATORY_CARE_PROVIDER_SITE_OTHER): Payer: BC Managed Care – PPO | Admitting: Podiatry

## 2022-09-05 ENCOUNTER — Ambulatory Visit (INDEPENDENT_AMBULATORY_CARE_PROVIDER_SITE_OTHER): Payer: BC Managed Care – PPO

## 2022-09-05 VITALS — BP 159/104 | HR 71

## 2022-09-05 DIAGNOSIS — M2011 Hallux valgus (acquired), right foot: Secondary | ICD-10-CM

## 2022-09-05 DIAGNOSIS — M2012 Hallux valgus (acquired), left foot: Secondary | ICD-10-CM

## 2022-09-05 DIAGNOSIS — M2141 Flat foot [pes planus] (acquired), right foot: Secondary | ICD-10-CM | POA: Diagnosis not present

## 2022-09-05 DIAGNOSIS — M722 Plantar fascial fibromatosis: Secondary | ICD-10-CM | POA: Diagnosis not present

## 2022-09-05 DIAGNOSIS — M2142 Flat foot [pes planus] (acquired), left foot: Secondary | ICD-10-CM

## 2022-09-05 MED ORDER — MELOXICAM 15 MG PO TABS: 15.0000 mg | ORAL_TABLET | Freq: Every day | ORAL | 0 refills | Status: DC | PRN

## 2022-09-05 NOTE — Patient Instructions (Signed)
For instructions on how to put on your Night Splint, please visit [email or web address]   Plantar Fasciitis (Heel Spur Syndrome) with Rehab The plantar fascia is a fibrous, ligament-like, soft-tissue structure that spans the bottom of the foot. Plantar fasciitis is a condition that causes pain in the foot due to inflammation of the tissue. SYMPTOMS   Pain and tenderness on the underneath side of the foot.  Pain that worsens with standing or walking. CAUSES  Plantar fasciitis is caused by irritation and injury to the plantar fascia on the underneath side of the foot. Common mechanisms of injury include:  Direct trauma to bottom of the foot.  Damage to a small nerve that runs under the foot where the main fascia attaches to the heel bone.  Stress placed on the plantar fascia due to bone spurs. RISK INCREASES WITH:   Activities that place stress on the plantar fascia (running, jumping, pivoting, or cutting).  Poor strength and flexibility.  Improperly fitted shoes.  Tight calf muscles.  Flat feet.  Failure to warm-up properly before activity.  Obesity. PREVENTION  Warm up and stretch properly before activity.  Allow for adequate recovery between workouts.  Maintain physical fitness:  Strength, flexibility, and endurance.  Cardiovascular fitness.  Maintain a health body weight.  Avoid stress on the plantar fascia.  Wear properly fitted shoes, including arch supports for individuals who have flat feet.  PROGNOSIS  If treated properly, then the symptoms of plantar fasciitis usually resolve without surgery. However, occasionally surgery is necessary.  RELATED COMPLICATIONS   Recurrent symptoms that may result in a chronic condition.  Problems of the lower back that are caused by compensating for the injury, such as limping.  Pain or weakness of the foot during push-off following surgery.  Chronic inflammation, scarring, and partial or complete fascia tear,  occurring more often from repeated injections.  TREATMENT  Treatment initially involves the use of ice and medication to help reduce pain and inflammation. The use of strengthening and stretching exercises may help reduce pain with activity, especially stretches of the Achilles tendon. These exercises may be performed at home or with a therapist. Your caregiver may recommend that you use heel cups of arch supports to help reduce stress on the plantar fascia. Occasionally, corticosteroid injections are given to reduce inflammation. If symptoms persist for greater than 6 months despite non-surgical (conservative), then surgery may be recommended.   MEDICATION   If pain medication is necessary, then nonsteroidal anti-inflammatory medications, such as aspirin and ibuprofen, or other minor pain relievers, such as acetaminophen, are often recommended.  Do not take pain medication within 7 days before surgery.  Prescription pain relievers may be given if deemed necessary by your caregiver. Use only as directed and only as much as you need.  Corticosteroid injections may be given by your caregiver. These injections should be reserved for the most serious cases, because they may only be given a certain number of times.  HEAT AND COLD  Cold treatment (icing) relieves pain and reduces inflammation. Cold treatment should be applied for 10 to 15 minutes every 2 to 3 hours for inflammation and pain and immediately after any activity that aggravates your symptoms. Use ice packs or massage the area with a piece of ice (ice massage).  Heat treatment may be used prior to performing the stretching and strengthening activities prescribed by your caregiver, physical therapist, or athletic trainer. Use a heat pack or soak the injury in warm water.  SEEK IMMEDIATE MEDICAL CARE   IF:  Treatment seems to offer no benefit, or the condition worsens.  Any medications produce adverse side effects.  EXERCISES- RANGE OF  MOTION (ROM) AND STRETCHING EXERCISES - Plantar Fasciitis (Heel Spur Syndrome) These exercises may help you when beginning to rehabilitate your injury. Your symptoms may resolve with or without further involvement from your physician, physical therapist or athletic trainer. While completing these exercises, remember:   Restoring tissue flexibility helps normal motion to return to the joints. This allows healthier, less painful movement and activity.  An effective stretch should be held for at least 30 seconds.  A stretch should never be painful. You should only feel a gentle lengthening or release in the stretched tissue.  RANGE OF MOTION - Toe Extension, Flexion  Sit with your right / left leg crossed over your opposite knee.  Grasp your toes and gently pull them back toward the top of your foot. You should feel a stretch on the bottom of your toes and/or foot.  Hold this stretch for 10 seconds.  Now, gently pull your toes toward the bottom of your foot. You should feel a stretch on the top of your toes and or foot.  Hold this stretch for 10 seconds. Repeat  times. Complete this stretch 3 times per day.   RANGE OF MOTION - Ankle Dorsiflexion, Active Assisted  Remove shoes and sit on a chair that is preferably not on a carpeted surface.  Place right / left foot under knee. Extend your opposite leg for support.  Keeping your heel down, slide your right / left foot back toward the chair until you feel a stretch at your ankle or calf. If you do not feel a stretch, slide your bottom forward to the edge of the chair, while still keeping your heel down.  Hold this stretch for 10 seconds. Repeat 3 times. Complete this stretch 2 times per day.   STRETCH  Gastroc, Standing  Place hands on wall.  Extend right / left leg, keeping the front knee somewhat bent.  Slightly point your toes inward on your back foot.  Keeping your right / left heel on the floor and your knee straight, shift  your weight toward the wall, not allowing your back to arch.  You should feel a gentle stretch in the right / left calf. Hold this position for 10 seconds. Repeat 3 times. Complete this stretch 2 times per day.  STRETCH  Soleus, Standing  Place hands on wall.  Extend right / left leg, keeping the other knee somewhat bent.  Slightly point your toes inward on your back foot.  Keep your right / left heel on the floor, bend your back knee, and slightly shift your weight over the back leg so that you feel a gentle stretch deep in your back calf.  Hold this position for 10 seconds. Repeat 3 times. Complete this stretch 2 times per day.  STRETCH  Gastrocsoleus, Standing  Note: This exercise can place a lot of stress on your foot and ankle. Please complete this exercise only if specifically instructed by your caregiver.   Place the ball of your right / left foot on a step, keeping your other foot firmly on the same step.  Hold on to the wall or a rail for balance.  Slowly lift your other foot, allowing your body weight to press your heel down over the edge of the step.  You should feel a stretch in your right / left calf.  Hold this position   for 10 seconds.  Repeat this exercise with a slight bend in your right / left knee. Repeat 3 times. Complete this stretch 2 times per day.   STRENGTHENING EXERCISES - Plantar Fasciitis (Heel Spur Syndrome)  These exercises may help you when beginning to rehabilitate your injury. They may resolve your symptoms with or without further involvement from your physician, physical therapist or athletic trainer. While completing these exercises, remember:   Muscles can gain both the endurance and the strength needed for everyday activities through controlled exercises.  Complete these exercises as instructed by your physician, physical therapist or athletic trainer. Progress the resistance and repetitions only as guided.  STRENGTH - Towel Curls  Sit in  a chair positioned on a non-carpeted surface.  Place your foot on a towel, keeping your heel on the floor.  Pull the towel toward your heel by only curling your toes. Keep your heel on the floor. Repeat 3 times. Complete this exercise 2 times per day.  STRENGTH - Ankle Inversion  Secure one end of a rubber exercise band/tubing to a fixed object (table, pole). Loop the other end around your foot just before your toes.  Place your fists between your knees. This will focus your strengthening at your ankle.  Slowly, pull your big toe up and in, making sure the band/tubing is positioned to resist the entire motion.  Hold this position for 10 seconds.  Have your muscles resist the band/tubing as it slowly pulls your foot back to the starting position. Repeat 3 times. Complete this exercises 2 times per day.  Document Released: 01/28/2005 Document Revised: 04/22/2011 Document Reviewed: 05/12/2008 [employer] Patient Information 2014 [employer].  

## 2022-09-05 NOTE — Progress Notes (Signed)
Subjective:   Patient ID: Tammy Butler, female   DOB: 37 y.o.   MRN: [id number]   HPI Chief Complaint  Patient presents with   Foot Pain    "I have bunion and heel pain on the left foot.  I have a bunion and swelling on the right foot." N - bunions painful L - bilateral, left > right D - last year - December O - gradually worse C - stiff, ache A - flexing toe, socks, steel toe shoes T - none  N - heel pain L - left plantar D - 3 mos O - gradually worse C - throbs, aches, stabbing pain A - standing, pressure T - wear insoles, new work shoes   37 year old female presents the office for above concerns.  Left heel occurs sporadically. The first steps hurt in the morning.  No recent injuries to this.  She has tried insoles.  Bunion pain has been ongoing on and off for a while. She has trouble stretching her toes she reports.  No recent treatment for this.   Review of Systems  All other systems reviewed and are negative.  Past Medical History:  Diagnosis Date   Allergy    Anemia    Anxiety    Asthma    Eczema    GERD (gastroesophageal reflux disease)    Pregnancy induced hypertension    Seasonal allergies     Past Surgical History:  Procedure Laterality Date   NO PAST SURGERIES       Current Outpatient Medications:    albuterol (VENTOLIN HFA) 108 (90 Base) MCG/ACT inhaler, Inhale 2 puffs into the lungs every 4 (four) hours as needed for wheezing or shortness of breath (cough, shortness of breath or wheezing.)., Disp: 18 g, Rfl: 2   ibuprofen (ADVIL,MOTRIN) 600 MG tablet, Take 1 tablet (600 mg total) by mouth every 6 (six) hours., Disp: 30 tablet, Rfl: 0   meloxicam (MOBIC) 15 MG tablet, Take 1 tablet (15 mg total) by mouth daily as needed for pain., Disp: 30 tablet, Rfl: 0  Allergies  Allergen Reactions   Other     Everything except peanuts and cashews          Objective:  Physical Exam  General: AAO x3, NAD  Dermatological: Skin is warm, dry and  supple bilateral. There are no open sores, no preulcerative lesions, no rash or signs of infection present.  Vascular: Dorsalis Pedis artery and Posterior Tibial artery pedal pulses are 2/4 bilateral with immedate capillary fill time. There is no pain with calf compression, swelling, warmth, erythema.   Neruologic: Grossly intact via light touch bilateral.   Musculoskeletal: There is a decreased medial arch upon weightbearing bilaterally.  Ankle, subtalar joint range of motion intact.  There is tenderness palpation on the plantar aspect of the calcaneus on insertion of plantar fascia along the arch of the foot as well.  There is tenderness on the area of the bunion.  No crepitation with MPJ range of motion.      Assessment:   Heel pain, Plantar fasciitis, bunion; flatfoot     Plan:  -Treatment options discussed including all alternatives, risks, and complications -Etiology of symptoms were discussed -X-rays were obtained and reviewed with the patient.  3 views of the feet were obtained bilaterally.  No evidence of acute fracture.  Very small inferior calcaneal spur present.significant increase in first intermetatarsal angle consistent with bunion. -In regards to the heel pain we discussed stretching, icing on a regular  basis.  Meloxicam as needed.  Discussed shoes, good arch support.  Given her foot that he thinks will benefit from orthotics.  Discussed custom versus over-the-counter inserts.  Consider physical therapy if needed as well. Night splint to help stretch the plantar fascia given the morning discomfort.  -For the bunion we discussed both conservative as well as surgical options.  I think this is coming from her flatfeet as well we discussed conservatively shoes, good arch support as well as wider toebox shoe.  Ultimately she may benefit from surgical intervention including Lapidus bunionectomy. -Prescribed mobic. Discussed side effects of the medication and directed to stop if any are  to occur and call the office.   Vivi Barrack DPM

## 2023-05-22 ENCOUNTER — Ambulatory Visit
Admission: EM | Admit: 2023-05-22 | Discharge: 2023-05-22 | Disposition: A | Discharge status: Home / Self Care | Attending: Family Medicine | Admitting: Family Medicine

## 2023-05-22 DIAGNOSIS — R030 Elevated blood-pressure reading, without diagnosis of hypertension: Secondary | ICD-10-CM | POA: Diagnosis not present

## 2023-05-22 DIAGNOSIS — J4521 Mild intermittent asthma with (acute) exacerbation: Secondary | ICD-10-CM

## 2023-05-22 DIAGNOSIS — R062 Wheezing: Secondary | ICD-10-CM

## 2023-05-22 MED ORDER — PREDNISONE 20 MG PO TABS: 40.0000 mg | ORAL_TABLET | Freq: Every day | ORAL | 0 refills | Status: AC

## 2023-05-22 MED ORDER — ALBUTEROL SULFATE HFA 108 (90 BASE) MCG/ACT IN AERS: 1.0000 | INHALATION_SPRAY | Freq: Four times a day (QID) | RESPIRATORY_TRACT | 0 refills | Status: AC | PRN

## 2023-05-22 MED ORDER — IPRATROPIUM-ALBUTEROL 0.5-2.5 (3) MG/3ML IN SOLN
3.0000 mL | Freq: Once | RESPIRATORY_TRACT | Status: AC
  Administered 2023-05-22: 3 mL via RESPIRATORY_TRACT

## 2023-05-22 NOTE — Discharge Instructions (Signed)
 Start prednisone daily for 5 days.  I have sent you an albuterol inhaler to use as needed for wheezing or shortness of breath.  Lots of rest and fluids.  Please follow-up with your PCP in 1 to 2 days for recheck.  Please go to the ER if you develop any worsening symptoms.  Hope you feel better soon!

## 2023-05-22 NOTE — ED Triage Notes (Signed)
 Pt states cough,wheezing and SOB since yesterday. States she has a history of asthma.  States she has been taking allegra and nasonex at home for her symptoms.

## 2023-05-22 NOTE — ED Provider Notes (Signed)
 [facility]    CSN: [id number] Arrival date & time: 05/22/23  1223      History   Chief Complaint Chief Complaint  Patient presents with   Cough    HPI Tammy Butler is a 38 y.o. female presents for asthma exacerbation.  Patient reports yesterday she began having cough with wheezing and shortness of breath.  Denies any URI symptoms such as sore throat, ear pain, body aches, congestion.  Does states she has seasonal allergies and thinks it is flaring her asthma.  She does not currently have an inhaler or nebulizer.  She has a "occasional" smoker.  She has not taken any OTC medications for symptoms.  No other concerns at this time.   Cough Associated symptoms: shortness of breath and wheezing     Past Medical History:  Diagnosis Date   Allergy    Anemia    Anxiety    Asthma    Eczema    GERD (gastroesophageal reflux disease)    Pregnancy induced hypertension    Seasonal allergies     Patient Active Problem List   Diagnosis Date Noted   Seasonal allergies 07/05/2020   Gastroesophageal reflux disease without esophagitis 01/21/2018   Elevated blood pressure reading 01/21/2018   Gestational hypertension 11/21/2017   Asthma, chronic 05/04/2014   Eczema 05/04/2014    Past Surgical History:  Procedure Laterality Date   NO PAST SURGERIES      OB History     Gravida  1   Para  1   Term  1   Preterm      AB      Living  1      SAB      IAB      Ectopic      Multiple  0   Live Births  1            Home Medications    Prior to Admission medications   Medication Sig Start Date End Date Taking? Authorizing Provider  albuterol (VENTOLIN HFA) 108 (90 Base) MCG/ACT inhaler Inhale 1-2 puffs into the lungs every 6 (six) hours as needed. 05/22/23  Yes Radford Pax, NP  predniSONE (DELTASONE) 20 MG tablet Take 2 tablets (40 mg total) by mouth daily with breakfast for 5 days. 05/22/23 05/27/23 Yes Radford Pax, NP  ibuprofen  (ADVIL,MOTRIN) 600 MG tablet Take 1 tablet (600 mg total) by mouth every 6 (six) hours. 11/25/17   Prothero, Henderson Newcomer, CNM  meloxicam (MOBIC) 15 MG tablet Take 1 tablet (15 mg total) by mouth daily as needed for pain. 09/05/22 09/05/23  Vivi Barrack, DPM    Family History Family History  Problem Relation Age of Onset   Hypertension Mother    Stroke Mother    Heart disease Mother    Cancer Maternal Grandfather    Arthritis Paternal Grandmother    Diabetes Paternal Grandmother    Cancer Paternal Grandfather     Social History Social History   Tobacco Use   Smoking status: Passive Smoke Exposure - Never Smoker   Smokeless tobacco: Never  Vaping Use   Vaping status: Never Used  Substance Use Topics   Alcohol use: Not Currently    Comment: occasional   Drug use: Yes    Types: Marijuana    Comment: CBD     Allergies   Other   Review of Systems Review of Systems  Respiratory:  Positive for cough, shortness of breath and wheezing.  Physical Exam Triage Vital Signs ED Triage Vitals  Encounter Vitals Group     BP 05/22/23 1309 (!) 173/99     Systolic BP Percentile --      Diastolic BP Percentile --      Pulse Rate 05/22/23 1309 80     Resp 05/22/23 1309 18     Temp 05/22/23 1309 98.3 F (36.8 C)     Temp Source 05/22/23 1309 Oral     SpO2 05/22/23 1309 96 %     Weight --      Height --      Head Circumference --      Peak Flow --      Pain Score 05/22/23 1310 0     Pain Loc --      Pain Education --      Exclude from Growth Chart --    No data found.  Updated Vital Signs BP (!) 154/98   Pulse 80   Temp 98.3 F (36.8 C) (Oral)   Resp 18   LMP 05/01/2023 (Approximate)   SpO2 96%   Breastfeeding No   Visual Acuity Right Eye Distance:   Left Eye Distance:   Bilateral Distance:    Right Eye Near:   Left Eye Near:    Bilateral Near:     Physical Exam Vitals and nursing note reviewed.  Constitutional:      General: She is not in acute  distress.    Appearance: She is well-developed. She is not ill-appearing.  HENT:     Head: Normocephalic and atraumatic.     Right Ear: Tympanic membrane and ear canal normal.     Left Ear: Tympanic membrane and ear canal normal.     Mouth/Throat:     Mouth: Mucous membranes are moist.     Pharynx: Oropharynx is clear. Uvula midline. No posterior oropharyngeal erythema.     Tonsils: No tonsillar exudate or tonsillar abscesses.  Eyes:     Conjunctiva/sclera: Conjunctivae normal.     Pupils: Pupils are equal, round, and reactive to light.  Cardiovascular:     Rate and Rhythm: Normal rate and regular rhythm.     Heart sounds: Normal heart sounds.  Pulmonary:     Effort: Pulmonary effort is normal.     Breath sounds: Wheezing present. No rhonchi or rales.     Comments: Diffuse expiratory wheezing in all lung fields Musculoskeletal:     Cervical back: Normal range of motion and neck supple.  Lymphadenopathy:     Cervical: No cervical adenopathy.  Skin:    General: Skin is warm and dry.  Neurological:     General: No focal deficit present.     Mental Status: She is alert and oriented to person, place, and time.  Psychiatric:        Mood and Affect: Mood normal.        Behavior: Behavior normal.      UC Treatments / Results  Labs (all labs ordered are listed, but only abnormal results are displayed) Labs Reviewed - No data to display  EKG   Radiology No results found.  Procedures Procedures (including critical care time)  Medications Ordered in UC Medications  ipratropium-albuterol (DUONEB) 0.5-2.5 (3) MG/3ML nebulizer solution 3 mL (3 mLs Nebulization Given 05/22/23 1332)    Initial Impression / Assessment and Plan / UC Course  I have reviewed the triage vital signs and the nursing notes.  Pertinent labs & imaging results that were available during my  care of the patient were reviewed by me and considered in my medical decision making (see chart for details).      Reviewed exam with patient.  No red flags.  She declined COVID or flu testing.  Wheezing improved after nebulizer and patient reports improvement in symptoms.  I did refill her albuterol inhaler to use as needed and will start prednisone daily for 5 days.  Discussed elevated BP on intake which did improve on recheck however diastolic still slightly elevated.  Advised her to keep a BP log and take to her PCP for further evaluation and treatment if indicated.  Advise follow-up with PCP in 1 to 2 days for recheck.  Strict ER precautions reviewed and patient verbalized understanding. Final Clinical Impressions(s) / UC Diagnoses   Final diagnoses:  Wheezing  Mild intermittent asthma with acute exacerbation  Elevated BP without diagnosis of hypertension     Discharge Instructions      Start prednisone daily for 5 days.  I have sent you an albuterol inhaler to use as needed for wheezing or shortness of breath.  Lots of rest and fluids.  Please follow-up with your PCP in 1 to 2 days for recheck.  Please go to the ER if you develop any worsening symptoms.  Hope you feel better soon!     ED Prescriptions     Medication Sig Dispense Auth. Provider   albuterol (VENTOLIN HFA) 108 (90 Base) MCG/ACT inhaler Inhale 1-2 puffs into the lungs every 6 (six) hours as needed. 1 each Radford Pax, NP   predniSONE (DELTASONE) 20 MG tablet Take 2 tablets (40 mg total) by mouth daily with breakfast for 5 days. 10 tablet Radford Pax, NP      PDMP not reviewed this encounter.   Radford Pax, NP 05/22/23 [phone]

## 2023-06-02 ENCOUNTER — Other Ambulatory Visit: Payer: Self-pay

## 2023-06-02 ENCOUNTER — Ambulatory Visit (INDEPENDENT_AMBULATORY_CARE_PROVIDER_SITE_OTHER): Admitting: Allergy & Immunology

## 2023-06-02 ENCOUNTER — Encounter: Payer: Self-pay | Admitting: Allergy & Immunology

## 2023-06-02 DIAGNOSIS — J310 Chronic rhinitis: Secondary | ICD-10-CM

## 2023-06-02 DIAGNOSIS — L2089 Other atopic dermatitis: Secondary | ICD-10-CM

## 2023-06-02 DIAGNOSIS — J4540 Moderate persistent asthma, uncomplicated: Secondary | ICD-10-CM

## 2023-06-02 MED ORDER — FLUTICASONE FUROATE-VILANTEROL 100-25 MCG/ACT IN AEPB: 1.0000 | INHALATION_SPRAY | Freq: Every day | RESPIRATORY_TRACT | 1 refills | Status: AC

## 2023-06-02 MED ORDER — TRIAMCINOLONE ACETONIDE 0.5 % EX OINT: 1.0000 | TOPICAL_OINTMENT | Freq: Two times a day (BID) | CUTANEOUS | 3 refills | Status: AC

## 2023-06-02 NOTE — Progress Notes (Signed)
 NEW PATIENT  Date of Service/Encounter:  06/02/23  Consult requested by: Rodney Clamp, MD   Assessment:   Chronic rhinitis - planning for skin testing at the next visit  Moderate persistent asthma, uncomplicated  Plan/Recommendations:   1. Moderate persistent asthma, uncomplicated - Lung testing looked low today (in the 60% range) and did improve with the albuterol  treatment. - We are going to start a daily controller medication called Symbicort to help with your breathing.  - Daily controller medication(s): Breo 100/25mcg one puff once daily - Prior to physical activity: albuterol  2 puffs 10-15 minutes before physical activity. - Rescue medications: albuterol  4 puffs every 4-6 hours as needed - Asthma control goals:  * Full participation in all desired activities (may need albuterol  before activity) * Albuterol  use two time or less a week on average (not counting use with activity) * Cough interfering with sleep two time or less a month * Oral steroids no more than once a year * No hospitalizations  2. Chronic rhinitis - Because of insurance stipulations, we cannot do skin testing on the same day as your first visit. - We are all working to fight this, but for now we need to do two separate visits.  - We will know more after we do testing at the next visit.  - The skin testing visit can be squeezed in at your convenience.  - Then we can make a more full plan to address all of your symptoms. - Be sure to stop your antihistamines for 3 days before this appointment.   3. Eczema - Continue with Aquaphor as you are doing. - Your skin looks great today. - Start taking triamcinolone  0.5% ointment to use twice daily as needed (DO NOT use on the face).   4. Return in about 1 week (around 06/09/2023) for SKIN TESTING. You can have the follow up appointment with Dr. Idolina Maker or a Nurse Practicioner (our Nurse Practitioners are excellent and always have Physician oversight!).     This note in its entirety was forwarded to the Provider who requested this consultation.  Subjective:   Tammy Butler is a 38 y.o. female presenting today for evaluation of  Chief Complaint  Patient presents with   Establish Care    Allergies--since childhood, have gotten worse over the last year. Using equate brand allegra, cetrizine, nasonex, and opticon a Asthma--not formorly diagnosed with asthma. Mucous build up mainly on left side. Chronic cough started around 6-7 years ago and wheezing.     Tammy Butler has a history of the following: Patient Active Problem List   Diagnosis Date Noted   Seasonal allergies 07/05/2020   Gastroesophageal reflux disease without esophagitis 01/21/2018   Elevated blood pressure reading 01/21/2018   Gestational hypertension 11/21/2017   Asthma, chronic 05/04/2014   Eczema 05/04/2014    History obtained from: chart review and patient.  Discussed the use of AI scribe software for clinical note transcription with the patient and/or guardian, who gave verbal consent to proceed.  Tammy Butler was referred by Rodney Clamp, MD.     Tammy Butler is a 38 y.o. female presenting with wheezing and shortness of breath.  Asthma/Respiratory Symptom History: She experiences wheezing and shortness of breath, which she describes as 'a little too much.' Her lung function test shows a result of sixty percent. She last used albuterol  yesterday and has been using it as needed. She was unaware of having asthma until she was prescribed inhalers, which she  finds somewhat helpful. She recalls a history of asthma exacerbations, with a notable episode in 2015 and a cough in 2013. She has been prescribed prednisone  in the past but has been forgetful with medication adherence, having taken it only for one day last week. She has not used any other inhalers besides albuterol .  She grew up in [place] and does not recall having shortness of breath during her  childhood. She notes that as she has gained weight, she experiences more shortness of breath with activities. She attempted to walk and run before the pollen season but found herself quickly out of breath.  Allergic Rhinitis Symptom History: She has environmental allergies, experiencing sneezing and itchy eyes, and notes that her eyes are dark due to rubbing. She also reports sinus infections and uses nasal spray for relief. She has oral allergy syndrome, with most fruits causing her mouth to itch.  Skin Symptom History: She has eczema, primarily on her neck, chest, and the bends of her elbows, and uses Aquaphor and hydrocortisone for management. She has a cat, which contributes to scratches on her skin, along with work-related metal exposure.  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 Her work involves exposure to [employer] D60 fluid, which is used for testing units.   Otherwise, there is no history of other atopic diseases, including drug allergies, stinging insect allergies, or contact dermatitis. There is no significant infectious history. Vaccinations are up to date.    Past Medical History: Patient Active Problem List   Diagnosis Date Noted   Seasonal allergies 07/05/2020   Gastroesophageal reflux disease without esophagitis 01/21/2018   Elevated blood pressure reading 01/21/2018   Gestational hypertension 11/21/2017   Asthma, chronic 05/04/2014   Eczema 05/04/2014    Medication List:  Allergies as of 06/02/2023       Reactions   Other    Everything except peanuts and cashews        Medication List        Accurate as of June 02, 2023  3:56 PM. If you have any questions, ask your nurse or doctor.          STOP taking these medications    meloxicam  15 MG tablet Commonly known as: MOBIC  Stopped by: Rochester Chuck       TAKE these medications    albuterol  108 (90 Base) MCG/ACT inhaler Commonly known as:  VENTOLIN  HFA Inhale 1-2 puffs into the lungs every 6 (six) hours as needed.   fluticasone  furoate-vilanterol 100-25 MCG/ACT Aepb Commonly known as: Breo Ellipta  Inhale 1 puff into the lungs daily. Started by: Rochester Chuck   ibuprofen  600 MG tablet Commonly known as: ADVIL  Take 1 tablet (600 mg total) by mouth every 6 (six) hours.   PRENATAL 1 + IRON PO Prenatal   triamcinolone  ointment 0.5 % Commonly known as: KENALOG  Apply 1 Application topically 2 (two) times daily. Started by: Rochester Chuck        Birth History: non-contributory  Developmental History: non-contributory  Past Surgical History: Past Surgical History:  Procedure Laterality Date   NO PAST SURGERIES     TYMPANOSTOMY TUBE PLACEMENT       Family History: Family History  Problem Relation Age of Onset   Angioedema Mother    Hypertension Mother    Stroke Mother    Heart disease Mother    Cancer Maternal Grandfather    Arthritis Paternal Grandmother    Diabetes  Paternal Grandmother    Cancer Paternal Grandfather      Social History: Tammy Butler lives at home with her family.  She lives in an apartment of unknown age.  There is hardwood in the main living areas and carpeting in bedroom.  They have gas heating and central cooling.  There are 2 cats and 1 dog in the home.  There are no dust mite covers on the bedding.  There is tobacco exposure in the house, but not the car.  She currently works on [employer] for the past 14 years.  There is exposure to fumes, chemicals, and dust.  There is no HEPA filter in the home.  [place]   Review of systems otherwise negative other than that mentioned in the HPI.    Objective:   Last menstrual period 05/01/2023. There is no height or weight on file to calculate BMI.     Physical Exam Vitals reviewed.  Constitutional:      Appearance: She is well-developed.     Comments: Very lovely.  Personable.   HENT:     Head: Normocephalic and atraumatic.     Right Ear: Tympanic membrane, ear canal and external ear normal. No drainage, swelling or tenderness. Tympanic membrane is not injected, scarred, erythematous, retracted or bulging.     Left Ear: Tympanic membrane, ear canal and external ear normal. No drainage, swelling or tenderness. Tympanic membrane is not injected, scarred, erythematous, retracted or bulging.     Nose: No nasal deformity, septal deviation, mucosal edema or rhinorrhea.     Right Turbinates: Enlarged, swollen and pale.     Left Turbinates: Enlarged, swollen and pale.     Right Sinus: No maxillary sinus tenderness or frontal sinus tenderness.     Left Sinus: No maxillary sinus tenderness or frontal sinus tenderness.     Mouth/Throat:     Mouth: Mucous membranes are not pale and not dry.     Pharynx: Uvula midline.  Eyes:     General: Lids are normal. Allergic shiner present.        Right eye: No discharge.        Left eye: No discharge.     Conjunctiva/sclera: Conjunctivae normal.     Right eye: Right conjunctiva is not injected. No chemosis.    Left eye: Left conjunctiva is not injected. No chemosis.    Pupils: Pupils are equal, round, and reactive to light.  Cardiovascular:     Rate and Rhythm: Normal rate and regular rhythm.     Heart sounds: Normal heart sounds.  Pulmonary:     Effort: Pulmonary effort is normal. No tachypnea, accessory muscle usage or respiratory distress.     Breath sounds: Normal breath sounds. No wheezing, rhonchi or rales.     Comments: Moving air well in all lung fields.  No increased work of breathing. Chest:     Chest wall: No tenderness.  Abdominal:     Tenderness: There is no abdominal tenderness. There is no guarding or rebound.  Lymphadenopathy:     Head:     Right side of head: No submandibular, tonsillar or occipital adenopathy.     Left side of head: No submandibular, tonsillar or occipital adenopathy.     Cervical: No  cervical adenopathy.  Skin:    General: Skin is warm.     Capillary Refill: Capillary refill takes less than 2 seconds.     Coloration: Skin is not pale.  Findings: No abrasion, erythema, petechiae or rash. Rash is not papular, urticarial or vesicular.     Comments: No eczematous or urticarial lesions noted.  Neurological:     Mental Status: She is alert.  Psychiatric:        Behavior: Behavior is cooperative.      Diagnostic studies:    Spirometry: results normal (FEV1: 1.47/61%, FVC: 2.15/75%, FEV1/FVC: 68%).    Spirometry consistent with moderate obstructive disease. Xopenex four puffs via MDI treatment given in clinic with significant improvement in FVC per ATS criteria.  Allergy Studies: deferred due to insurance stipulations that require a separate visit for testing         Drexel Gentles, MD [facility] 

## 2023-06-02 NOTE — Patient Instructions (Addendum)
 1. Moderate persistent asthma, uncomplicated - Lung testing looked low today (in the 60% range) and did improve with the albuterol  treatment. - We are going to start a daily controller medication called Symbicort to help with your breathing.  - Daily controller medication(s): Breo 100/25mcg one puff once daily - Prior to physical activity: albuterol  2 puffs 10-15 minutes before physical activity. - Rescue medications: albuterol  4 puffs every 4-6 hours as needed - Asthma control goals:  * Full participation in all desired activities (may need albuterol  before activity) * Albuterol  use two time or less a week on average (not counting use with activity) * Cough interfering with sleep two time or less a month * Oral steroids no more than once a year * No hospitalizations  2. Chronic rhinitis - Because of insurance stipulations, we cannot do skin testing on the same day as your first visit. - We are all working to fight this, but for now we need to do two separate visits.  - We will know more after we do testing at the next visit.  - The skin testing visit can be squeezed in at your convenience.  - Then we can make a more full plan to address all of your symptoms. - Be sure to stop your antihistamines for 3 days before this appointment.   3. Eczema - Continue with Aquaphor as you are doing. - Your skin looks great today. - Start taking triamcinolone  0.5% ointment to use twice daily as needed (DO NOT use on the face).   4. Return in about 1 week (around 06/09/2023) for SKIN TESTING. You can have the follow up appointment with Dr. Idolina Maker or a Nurse Practicioner (our Nurse Practitioners are excellent and always have Physician oversight!).    Please inform us  of any Emergency Department visits, hospitalizations, or changes in symptoms. Call us  before going to the ED for breathing or allergy symptoms since we might be able to fit you in for a sick visit. Feel free to contact us  anytime with any  questions, problems, or concerns.  It was a pleasure to meet you today!  Websites that have reliable patient information: 1. American Academy of Asthma, Allergy, and Immunology: www.aaaai.org 2. Food Allergy Research and Education (FARE): foodallergy.org 3. Mothers of Asthmatics: http://www.asthmacommunitynetwork.org 4. American College of Allergy, Asthma, and Immunology: www.acaai.org      "Like" us  on Facebook and Instagram for our latest updates!      A healthy democracy works best when [employer] participate! Make sure you are registered to vote! If you have moved or changed any of your contact information, you will need to get this updated before voting! Scan the QR codes below to learn more!

## 2023-06-12 ENCOUNTER — Encounter: Payer: Self-pay | Admitting: Family Medicine

## 2023-06-12 ENCOUNTER — Ambulatory Visit (INDEPENDENT_AMBULATORY_CARE_PROVIDER_SITE_OTHER): Admitting: Family Medicine

## 2023-06-12 DIAGNOSIS — J3089 Other allergic rhinitis: Secondary | ICD-10-CM

## 2023-06-12 DIAGNOSIS — J302 Other seasonal allergic rhinitis: Secondary | ICD-10-CM

## 2023-06-12 NOTE — Progress Notes (Signed)
   [address] [place] [address] Dept: [phone]  FOLLOW UP NOTE  Patient ID: Tammy Butler, female    DOB: 15-Jan-1986  Age: 38 y.o. MRN: [id number] Date of Office Visit: 06/12/2023  Assessment  Chief Complaint: Allergy Testing  HPI Tammy Butler is a 38 year old female who presents to the clinic for follow-up visit.  She was last seen in this clinic on 06/02/2023 by Dr. Idolina Maker for evaluation of asthma, allergic rhinitis, and atopic dermatitis.  She presents to the clinic today for environmental allergy skin testing.  At today's visit, she reports that she is feeling well overall with no cardiopulmonary, gastrointestinal, or integumentary symptoms.  She has not had any antihistamines over the last 3 days.  Her current medications are listed in the chart.  Drug Allergies:  Allergies  Allergen Reactions   Other     Everything except peanuts and cashews    Physical Exam: LMP 05/01/2023 (Approximate)    Diagnostic testing   Percutaneous environmental allergy skin testing was positive to grass pollen, weed pollen, and tree pollen with adequate controls  Intradermal environmental allergy skin testing was positive to mold mix 3, mold mix 4, dust mite mix, cat hair, dog epithelia, and [place] cockroach.   Assessment and Plan: 1. Seasonal and perennial allergic rhinitis     Patient Instructions  Allergic rhinitis Your skin testing was positive to grass pollen, weed pollen, tree pollen, mold, dust mite, cat hair, dog epithelia, and cockroach.  Allergen avoidance measures are listed below Continue an antihistamine once a day if needed for runny nose or itch.  He may take an additional dose of antihistamine once a day if needed for breakthrough symptoms Continue Flonase 1 to 2 sprays in each nostril once a day if needed for a stuffy nose.  In the right nostril, point the applicator out toward the right ear. In the left nostril, point the applicator out toward the  left ear Consider saline nasal rinses as needed for nasal symptoms. Use this before any medicated nasal sprays for best result Consider allergen immunotherapy if your symptoms are not well-controlled with the treatment plan as listed above.  Written information provided about Rush therapy and traditional immunotherapy.  Call the clinic if this treatment plan is not working well for you.  Follow up in 3 months or sooner if needed.   Return in about 3 months (around 09/12/2023), or if symptoms worsen or fail to improve.    Thank you for the opportunity to care for this patient.  Please do not hesitate to contact me with questions.  Marinus Sic, FNP [facility]

## 2023-06-12 NOTE — Patient Instructions (Addendum)
 Allergic rhinitis Your skin testing was positive to grass pollen, weed pollen, tree pollen, mold, dust mite, cat hair, dog epithelia, and cockroach.  Allergen avoidance measures are listed below Continue an antihistamine once a day if needed for runny nose or itch.  He may take an additional dose of antihistamine once a day if needed for breakthrough symptoms Continue Flonase 1 to 2 sprays in each nostril once a day if needed for a stuffy nose.  In the right nostril, point the applicator out toward the right ear. In the left nostril, point the applicator out toward the left ear Consider saline nasal rinses as needed for nasal symptoms. Use this before any medicated nasal sprays for best result Consider allergen immunotherapy if your symptoms are not well-controlled with the treatment plan as listed above.  Written information provided about Rush therapy and traditional immunotherapy.  Call the clinic if this treatment plan is not working well for you.  Follow up in 3 months or sooner if needed.  Reducing Pollen Exposure The American Academy of Allergy, Asthma and Immunology suggests the following steps to reduce your exposure to pollen during allergy seasons. Do not hang sheets or clothing out to dry; pollen may collect on these items. Do not mow lawns or spend time around freshly cut grass; mowing stirs up pollen. Keep windows closed at night.  Keep car windows closed while driving. Minimize morning activities outdoors, a time when pollen counts are usually at their highest. Stay indoors as much as possible when pollen counts or humidity is high and on windy days when pollen tends to remain in the air longer. Use air conditioning when possible.  Many air conditioners have filters that trap the pollen spores. Use a HEPA room air filter to remove pollen form the indoor air you breathe.  Control of Mold Allergen Mold and fungi can grow on a variety of surfaces provided certain temperature and  moisture conditions exist.  Outdoor molds grow on plants, decaying vegetation and soil.  The major outdoor mold, Alternaria and Cladosporium, are found in very high numbers during hot and dry conditions.  Generally, a late Summer - Fall peak is seen for common outdoor fungal spores.  Rain will temporarily lower outdoor mold spore count, but counts rise rapidly when the rainy period ends.  The most important indoor molds are Aspergillus and Penicillium.  Dark, humid and poorly ventilated basements are ideal sites for mold growth.  The next most common sites of mold growth are the bathroom and the kitchen.  Outdoor [employer] Use air conditioning and keep windows closed Avoid exposure to decaying vegetation. Avoid leaf raking. Avoid grain handling. Consider wearing a face mask if working in moldy areas.  Indoor Mold Control Maintain humidity below 50%. Clean washable surfaces with 5% bleach solution. Remove sources e.g. Contaminated carpets.   Control of Dust Mite Allergen Dust mites play a major role in allergic asthma and rhinitis. They occur in environments with high humidity wherever human skin is found. Dust mites absorb humidity from the atmosphere (ie, they do not drink) and feed on organic matter (including shed human and animal skin). Dust mites are a microscopic type of insect that you cannot see with the naked eye. High levels of dust mites have been detected from mattresses, pillows, carpets, upholstered furniture, bed covers, clothes, soft toys and any woven material. The principal allergen of the dust mite is found in its feces. A gram of dust may contain 1,000 mites and 250,000 fecal  particles. Mite antigen is easily measured in the air during house cleaning activities. Dust mites do not bite and do not cause harm to humans, other than by triggering allergies/asthma.  Ways to decrease your exposure to dust mites in your home:  1. Encase mattresses, box springs and pillows with a  mite-impermeable barrier or cover  2. Wash sheets, blankets and drapes weekly in hot water (130 F) with detergent and dry them in a dryer on the hot setting.  3. Have the room cleaned frequently with a vacuum cleaner and a damp dust-mop. For carpeting or rugs, vacuuming with a vacuum cleaner equipped with a high-efficiency particulate air (HEPA) filter. The dust mite allergic individual should not be in a room which is being cleaned and should wait 1 hour after cleaning before going into the room.  4. Do not sleep on upholstered furniture (eg, couches).  5. If possible removing carpeting, upholstered furniture and drapery from the home is ideal. Horizontal blinds should be eliminated in the rooms where the person spends the most time (bedroom, study, television room). Washable vinyl, roller-type shades are optimal.  6. Remove all non-washable stuffed toys from the bedroom. Wash stuffed toys weekly like sheets and blankets above.  7. Reduce indoor humidity to less than 50%. Inexpensive humidity monitors can be purchased at most hardware stores. Do not use a humidifier as can make the problem worse and are not recommended.  Control of Dog or Cat Allergen Avoidance is the best way to manage a dog or cat allergy. If you have a dog or cat and are allergic to dog or cats, consider removing the dog or cat from the home. If you have a dog or cat but don't want to find it a new home, or if your family wants a pet even though someone in the household is allergic, here are some strategies that may help keep symptoms at bay:  Keep the pet out of your bedroom and restrict it to only a few rooms. Be advised that keeping the dog or cat in only one room will not limit the allergens to that room. Don't pet, hug or kiss the dog or cat; if you do, wash your hands with soap and water. High-efficiency particulate air (HEPA) cleaners run continuously in a bedroom or living room can reduce allergen levels over  time. Regular use of a high-efficiency vacuum cleaner or a central vacuum can reduce allergen levels. Giving your dog or cat a bath at least once a week can reduce airborne allergen.  Control of Cockroach Allergen Cockroach allergen has been identified as an important cause of acute attacks of asthma, especially in urban settings.  There are fifty-five species of cockroach that exist in the [place] , however only three, the American, [place] and [place] species produce allergen that can affect patients with Asthma.  Allergens can be obtained from fecal particles, egg casings and secretions from cockroaches.  Thanks    Remove food sources. Reduce access to water. Seal access and entry points. Spray runways with 0.5-1% Diazinon or Chlorpyrifos Blow boric acid power under stoves and refrigerator. Place bait stations (hydramethylnon) at feeding sites.

## 2023-06-23 ENCOUNTER — Ambulatory Visit (INDEPENDENT_AMBULATORY_CARE_PROVIDER_SITE_OTHER)

## 2023-06-23 ENCOUNTER — Ambulatory Visit
Admission: EM | Admit: 2023-06-23 | Discharge: 2023-06-23 | Disposition: A | Discharge status: Home / Self Care | Attending: Family Medicine | Admitting: Family Medicine

## 2023-06-23 ENCOUNTER — Other Ambulatory Visit: Payer: Self-pay

## 2023-06-23 DIAGNOSIS — M25561 Pain in right knee: Secondary | ICD-10-CM

## 2023-06-23 DIAGNOSIS — S8391XA Sprain of unspecified site of right knee, initial encounter: Secondary | ICD-10-CM | POA: Diagnosis not present

## 2023-06-23 MED ORDER — METHYLPREDNISOLONE 4 MG PO TBPK: ORAL_TABLET | ORAL | 0 refills | Status: AC

## 2023-06-23 NOTE — ED Triage Notes (Signed)
 Pt states she was moving furniture and her right knee buckled in and she heard it "pop" twice 2 days ago. Pt states it has been painful to walk on since. Pt states she has been using ibuprofen  and ice for it, but it's not helping a lot. Pt states she still feels popping in her knee and it doesn't feel stable. Pt limped to exam room. Pt has 1+ swelling of right knee.

## 2023-06-23 NOTE — ED Provider Notes (Signed)
 [facility]    CSN: [id number] Arrival date & time: 06/23/23  4098      History   Chief Complaint No chief complaint on file.   HPI Tammy Butler is a 38 y.o. female presents for knee pain.  Patient reports she was moving some furniture over the weekend when she heard a pop x 2 and has since had right knee pain.  She was able to bear weight with some discomfort but states she has limited range of motion due to the pain and swelling.  Denies any injury such as fall.  Reports history of any injury 16 years ago but otherwise no surgeries.  She has been doing ibuprofen  and icing with minimal improvement.  No other concerns at this time.  HPI  Past Medical History:  Diagnosis Date   Allergy     Anemia    Angio-edema    Anxiety    Asthma    Eczema    GERD (gastroesophageal reflux disease)    Pregnancy induced hypertension    Recurrent upper respiratory infection (URI)    Seasonal allergies     Patient Active Problem List   Diagnosis Date Noted   Seasonal allergies 07/05/2020   Gastroesophageal reflux disease without esophagitis 01/21/2018   Elevated blood pressure reading 01/21/2018   Gestational hypertension 11/21/2017   Asthma, chronic 05/04/2014   Eczema 05/04/2014    Past Surgical History:  Procedure Laterality Date   NO PAST SURGERIES     TYMPANOSTOMY TUBE PLACEMENT      OB History     Gravida  1   Para  1   Term  1   Preterm      AB      Living  1      SAB      IAB      Ectopic      Multiple  0   Live Births  1            Home Medications    Prior to Admission medications   Medication Sig Start Date End Date Taking? Authorizing Provider  methylPREDNISolone  (MEDROL  DOSEPAK) 4 MG TBPK tablet Take as prescribed on package 06/23/23  Yes Gicela Schwarting, Jodi R, NP  albuterol  (VENTOLIN  HFA) 108 (90 Base) MCG/ACT inhaler Inhale 1-2 puffs into the lungs every 6 (six) hours as needed. 05/22/23   Addalynn Kumari, Jodi R, NP  fluticasone   furoate-vilanterol (BREO ELLIPTA ) 100-25 MCG/ACT AEPB Inhale 1 puff into the lungs daily. 06/02/23 08/31/23  Rochester Chuck, MD  ibuprofen  (ADVIL ,MOTRIN ) 600 MG tablet Take 1 tablet (600 mg total) by mouth every 6 (six) hours. 11/25/17   Prothero, Jonelle Neri, CNM  Prenatal Multivit-Min-Fe-FA (PRENATAL 1 + IRON PO) Prenatal    [provider]  triamcinolone  ointment (KENALOG ) 0.5 % Apply 1 Application topically 2 (two) times daily. 06/02/23   Rochester Chuck, MD    Family History Family History  Problem Relation Age of Onset   Angioedema Mother    Hypertension Mother    Stroke Mother    Heart disease Mother    Cancer Maternal Grandfather    Arthritis Paternal Grandmother    Diabetes Paternal Grandmother    Cancer Paternal Grandfather     Social History Social History   Tobacco Use   Smoking status: Passive Smoke Exposure - Never Smoker   Smokeless tobacco: Never  Vaping Use   Vaping status: Never Used  Substance Use Topics   Alcohol use: Not Currently  Comment: occasional   Drug use: Yes    Types: Marijuana    Comment: CBD     Allergies   Other   Review of Systems Review of Systems  Musculoskeletal:        Right knee pain     Physical Exam Triage Vital Signs ED Triage Vitals  Encounter Vitals Group     BP 06/23/23 0902 (!) 141/106     Systolic BP Percentile --      Diastolic BP Percentile --      Pulse Rate 06/23/23 0902 77     Resp 06/23/23 0902 17     Temp 06/23/23 0902 98.8 F (37.1 C)     Temp Source 06/23/23 0902 Oral     SpO2 06/23/23 0902 97 %     Weight --      Height --      Head Circumference --      Peak Flow --      Pain Score 06/23/23 0859 6     Pain Loc --      Pain Education --      Exclude from Growth Chart --    No data found.  Updated Vital Signs BP (!) 141/106   Pulse 77   Temp 98.8 F (37.1 C) (Oral)   Resp 17   LMP 05/31/2023   SpO2 97%   Visual Acuity Right Eye Distance:   Left Eye Distance:    Bilateral Distance:    Right Eye Near:   Left Eye Near:    Bilateral Near:     Physical Exam Vitals and nursing note reviewed.  Constitutional:      General: She is not in acute distress.    Appearance: Normal appearance. She is not ill-appearing.  HENT:     Head: Normocephalic and atraumatic.  Eyes:     Pupils: Pupils are equal, round, and reactive to light.  Cardiovascular:     Rate and Rhythm: Normal rate.  Pulmonary:     Effort: Pulmonary effort is normal.  Musculoskeletal:     Right knee: Swelling present. No deformity, effusion, erythema, ecchymosis, lacerations, bony tenderness or crepitus. Decreased range of motion. Tenderness present over the medial joint line. Normal patellar mobility.     Comments: Positive valgus stress test.  Reduced range of motion with flexion due to pain.  Skin:    General: Skin is warm and dry.  Neurological:     General: No focal deficit present.     Mental Status: She is alert and oriented to person, place, and time.  Psychiatric:        Mood and Affect: Mood normal.        Behavior: Behavior normal.      UC Treatments / Results  Labs (all labs ordered are listed, but only abnormal results are displayed) Labs Reviewed - No data to display  EKG   Radiology DG Knee Complete 4 Views Right Result Date: 06/23/2023 CLINICAL DATA:  Preop oblong move) to this weekend. EXAM: RIGHT KNEE - COMPLETE 4 VIEW COMPARISON:  None Available. FINDINGS: No evidence of fracture, dislocation, or joint effusion. No evidence of arthropathy or other focal bone abnormality. Soft tissues are unremarkable. IMPRESSION: No acute osseous abnormality. Electronically Signed   By: Adrianna Horde M.D.   On: 06/23/2023 09:50    Procedures Procedures (including critical care time)  Medications Ordered in UC Medications - No data to display  Initial Impression / Assessment and Plan / UC Course  I have reviewed the triage vital signs and the nursing  notes.  Pertinent labs & imaging results that were available during my care of the patient were reviewed by me and considered in my medical decision making (see chart for details).     Reviewed exam and symptoms with patient.  No red flags.  Wet read of x-ray without obvious abnormalities, will contact for any positive results based on radiology overread.  Discussed knee sprain.  Patient fitted with knee brace and advised continuation of RICE therapy.  Will do Medrol  Dosepak as prescribed.  PCP follow-up 1 week for recheck or symptoms do not improve in case additional imaging is indicated.  ER precautions reviewed and patient verbalized understanding. Final Clinical Impressions(s) / UC Diagnoses   Final diagnoses:  Acute pain of right knee  Sprain of right knee, unspecified ligament, initial encounter     Discharge Instructions      Start the Medrol  Dosepak as prescribed.  Use the knee brace to help support the joint as well as help with swelling.  Continue elevation and ice as needed.  Follow-up with your PCP in 1 week for recheck or if your symptoms or not improving.  Please go to the ER for any worsening symptoms.  Hope you feel better soon!   ED Prescriptions     Medication Sig Dispense Auth. Provider   methylPREDNISolone  (MEDROL  DOSEPAK) 4 MG TBPK tablet Take as prescribed on package 21 tablet Jaquari Reckner, Jodi R, NP      PDMP not reviewed this encounter.   Alleen Arbour, NP 06/23/23 [phone]

## 2023-06-23 NOTE — Discharge Instructions (Signed)
 Start the Medrol  Dosepak as prescribed.  Use the knee brace to help support the joint as well as help with swelling.  Continue elevation and ice as needed.  Follow-up with your PCP in 1 week for recheck or if your symptoms or not improving.  Please go to the ER for any worsening symptoms.  Hope you feel better soon!
